# Patient Record
Sex: Female | Born: 1970 | Race: White | Hispanic: Yes | Marital: Single | Smoking: Current every day smoker
Health system: Southern US, Community
[De-identification: ages and names within clinical notes are randomized; demographics above are authoritative.]

## PROBLEM LIST (undated history)

## (undated) DIAGNOSIS — K5792 Diverticulitis of intestine, part unspecified, without perforation or abscess without bleeding: Secondary | ICD-10-CM

## (undated) HISTORY — PX: ABDOMINAL SURGERY: SHX537

## (undated) HISTORY — PX: NO PAST SURGERIES: SHX2092

---

## 2012-08-26 ENCOUNTER — Inpatient Hospital Stay (HOSPITAL_COMMUNITY)
Admission: EM | Admit: 2012-08-26 | Discharge: 2012-09-06 | DRG: 392 | Disposition: A | Discharge status: Home / Self Care | Payer: Medicaid Other | Attending: Family Medicine | Admitting: Family Medicine

## 2012-08-26 ENCOUNTER — Emergency Department (HOSPITAL_COMMUNITY): Payer: Medicaid Other

## 2012-08-26 DIAGNOSIS — K631 Perforation of intestine (nontraumatic): Secondary | ICD-10-CM | POA: Diagnosis present

## 2012-08-26 DIAGNOSIS — K5792 Diverticulitis of intestine, part unspecified, without perforation or abscess without bleeding: Secondary | ICD-10-CM | POA: Diagnosis present

## 2012-08-26 DIAGNOSIS — A498 Other bacterial infections of unspecified site: Secondary | ICD-10-CM | POA: Diagnosis present

## 2012-08-26 DIAGNOSIS — R109 Unspecified abdominal pain: Secondary | ICD-10-CM | POA: Diagnosis present

## 2012-08-26 DIAGNOSIS — R7881 Bacteremia: Secondary | ICD-10-CM | POA: Diagnosis present

## 2012-08-26 DIAGNOSIS — K5720 Diverticulitis of large intestine with perforation and abscess without bleeding: Secondary | ICD-10-CM | POA: Diagnosis present

## 2012-08-26 DIAGNOSIS — K5732 Diverticulitis of large intestine without perforation or abscess without bleeding: Secondary | ICD-10-CM

## 2012-08-26 DIAGNOSIS — D509 Iron deficiency anemia, unspecified: Secondary | ICD-10-CM | POA: Diagnosis present

## 2012-08-26 DIAGNOSIS — K630 Abscess of intestine: Secondary | ICD-10-CM | POA: Diagnosis present

## 2012-08-26 DIAGNOSIS — D649 Anemia, unspecified: Secondary | ICD-10-CM | POA: Diagnosis present

## 2012-08-26 LAB — URINALYSIS, ROUTINE W REFLEX MICROSCOPIC
Bilirubin Urine: NEGATIVE
Ketones, ur: NEGATIVE mg/dL
Leukocytes, UA: NEGATIVE
Nitrite: NEGATIVE
Protein, ur: 30 mg/dL — AB
Urobilinogen, UA: 0.2 mg/dL (ref 0.0–1.0)
pH: 8.5 — ABNORMAL HIGH (ref 5.0–8.0)

## 2012-08-26 LAB — URINE MICROSCOPIC-ADD ON

## 2012-08-26 LAB — CBC WITH DIFFERENTIAL/PLATELET
Eosinophils Absolute: 0.1 10*3/uL (ref 0.0–0.7)
Hemoglobin: 11.8 g/dL — ABNORMAL LOW (ref 12.0–15.0)
Lymphocytes Relative: 15 % (ref 12–46)
Lymphs Abs: 1.6 10*3/uL (ref 0.7–4.0)
MCH: 29.4 pg (ref 26.0–34.0)
MCV: 86.8 fL (ref 78.0–100.0)
Monocytes Relative: 12 % (ref 3–12)
Neutrophils Relative %: 71 % (ref 43–77)
Platelets: 398 10*3/uL (ref 150–400)
RBC: 4.02 MIL/uL (ref 3.87–5.11)
WBC: 10.6 10*3/uL — ABNORMAL HIGH (ref 4.0–10.5)

## 2012-08-26 LAB — COMPREHENSIVE METABOLIC PANEL
ALT: 9 U/L (ref 0–35)
Alkaline Phosphatase: 68 U/L (ref 39–117)
BUN: 9 mg/dL (ref 6–23)
CO2: 26 mEq/L (ref 19–32)
GFR calc Af Amer: 88 mL/min — ABNORMAL LOW (ref >90)
GFR calc non Af Amer: 76 mL/min — ABNORMAL LOW (ref >90)
Glucose, Bld: 118 mg/dL — ABNORMAL HIGH (ref 70–99)
Potassium: 4 mEq/L (ref 3.5–5.1)
Sodium: 140 mEq/L (ref 135–145)
Total Bilirubin: 0.4 mg/dL (ref 0.3–1.2)

## 2012-08-26 NOTE — ED Notes (Signed)
Pt tearful in waiting room, crying out in pain. Pt ambulatory. Will continue to monitor.

## 2012-08-26 NOTE — ED Notes (Signed)
Pt returned from xray, no distress noted 

## 2012-08-26 NOTE — ED Notes (Signed)
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  She states this is in her 16th day w/o etoh.  She states on Thursday she had onset of abd pain.  She states has not had a bm since Tuesday.  She states she has tried otc tx w/o relief.  She passed only watery stools.  Patient denies n/v.  Patient last po intake was today,  She is drinking only water.  She has noted fever in triage.  Patient states she has diffuse abd pain

## 2012-08-27 ENCOUNTER — Encounter (HOSPITAL_COMMUNITY): Payer: Self-pay | Admitting: Emergency Medicine

## 2012-08-27 ENCOUNTER — Emergency Department (HOSPITAL_COMMUNITY): Payer: Medicaid Other

## 2012-08-27 ENCOUNTER — Inpatient Hospital Stay (HOSPITAL_COMMUNITY): Payer: Medicaid Other

## 2012-08-27 DIAGNOSIS — R109 Unspecified abdominal pain: Secondary | ICD-10-CM | POA: Diagnosis present

## 2012-08-27 DIAGNOSIS — K5732 Diverticulitis of large intestine without perforation or abscess without bleeding: Principal | ICD-10-CM

## 2012-08-27 DIAGNOSIS — K631 Perforation of intestine (nontraumatic): Secondary | ICD-10-CM

## 2012-08-27 DIAGNOSIS — K5720 Diverticulitis of large intestine with perforation and abscess without bleeding: Secondary | ICD-10-CM | POA: Diagnosis present

## 2012-08-27 DIAGNOSIS — K630 Abscess of intestine: Secondary | ICD-10-CM

## 2012-08-27 DIAGNOSIS — K5792 Diverticulitis of intestine, part unspecified, without perforation or abscess without bleeding: Secondary | ICD-10-CM | POA: Diagnosis present

## 2012-08-27 LAB — CBC WITH DIFFERENTIAL/PLATELET
Lymphocytes Relative: 13 % (ref 12–46)
Lymphs Abs: 1.5 10*3/uL (ref 0.7–4.0)
MCH: 29.6 pg (ref 26.0–34.0)
MCV: 86.5 fL (ref 78.0–100.0)
Monocytes Absolute: 1.4 10*3/uL — ABNORMAL HIGH (ref 0.1–1.0)
Monocytes Relative: 12 % (ref 3–12)
Neutro Abs: 8.5 10*3/uL — ABNORMAL HIGH (ref 1.7–7.7)
Platelets: 361 10*3/uL (ref 150–400)
RBC: 3.71 MIL/uL — ABNORMAL LOW (ref 3.87–5.11)
RDW: 13.5 % (ref 11.5–15.5)
WBC: 11.5 10*3/uL — ABNORMAL HIGH (ref 4.0–10.5)

## 2012-08-27 LAB — URINALYSIS, ROUTINE W REFLEX MICROSCOPIC
Bilirubin Urine: NEGATIVE
Glucose, UA: NEGATIVE mg/dL
Hgb urine dipstick: NEGATIVE
Specific Gravity, Urine: 1.012 (ref 1.005–1.030)
pH: 6 (ref 5.0–8.0)

## 2012-08-27 LAB — LIPASE, BLOOD: Lipase: 12 U/L (ref 11–59)

## 2012-08-27 MED ORDER — ONDANSETRON HCL 4 MG PO TABS: 4.0000 mg | ORAL_TABLET | Freq: Four times a day (QID) | ORAL | Status: DC | PRN

## 2012-08-27 MED ORDER — HYDROMORPHONE HCL PF 1 MG/ML IJ SOLN
1.0000 mg | INTRAMUSCULAR | Status: DC | PRN
  Administered 2012-08-28 – 2012-09-06 (×32): 1 mg via INTRAVENOUS
  Filled 2012-08-27 (×38): qty 1

## 2012-08-27 MED ORDER — METRONIDAZOLE IN NACL 5-0.79 MG/ML-% IV SOLN
500.0000 mg | Freq: Once | INTRAVENOUS | Status: AC
  Administered 2012-08-27: 500 mg via INTRAVENOUS
  Filled 2012-08-27: qty 100

## 2012-08-27 MED ORDER — MORPHINE SULFATE 4 MG/ML IJ SOLN
4.0000 mg | Freq: Once | INTRAMUSCULAR | Status: AC
  Administered 2012-08-27: 4 mg via INTRAVENOUS
  Filled 2012-08-27: qty 1

## 2012-08-27 MED ORDER — HYDROMORPHONE HCL PF 1 MG/ML IJ SOLN
1.0000 mg | INTRAMUSCULAR | Status: DC | PRN
  Administered 2012-08-27 (×2): 1 mg via INTRAVENOUS
  Filled 2012-08-27 (×2): qty 1

## 2012-08-27 MED ORDER — SODIUM CHLORIDE 0.9 % IV SOLN
INTRAVENOUS | Status: DC
  Administered 2012-08-27: 07:00:00 via INTRAVENOUS

## 2012-08-27 MED ORDER — OXYCODONE HCL 5 MG PO TABS
5.0000 mg | ORAL_TABLET | ORAL | Status: DC | PRN
  Administered 2012-08-27 – 2012-09-06 (×20): 5 mg via ORAL
  Filled 2012-08-27 (×22): qty 1

## 2012-08-27 MED ORDER — ONDANSETRON HCL 4 MG/2ML IJ SOLN
4.0000 mg | Freq: Once | INTRAMUSCULAR | Status: AC
  Administered 2012-08-27: 4 mg via INTRAVENOUS
  Filled 2012-08-27: qty 2

## 2012-08-27 MED ORDER — ONDANSETRON HCL 4 MG/2ML IJ SOLN
4.0000 mg | Freq: Four times a day (QID) | INTRAMUSCULAR | Status: DC | PRN
  Administered 2012-08-27 – 2012-09-05 (×7): 4 mg via INTRAVENOUS
  Filled 2012-08-27 (×10): qty 2

## 2012-08-27 MED ORDER — ONDANSETRON HCL 4 MG/2ML IJ SOLN
INTRAMUSCULAR | Status: AC
  Filled 2012-08-27: qty 2

## 2012-08-27 MED ORDER — SODIUM CHLORIDE 0.9 % IV SOLN
INTRAVENOUS | Status: DC
  Administered 2012-08-27 – 2012-08-28 (×2): via INTRAVENOUS
  Administered 2012-08-28: 125 mL/h via INTRAVENOUS
  Administered 2012-08-29 – 2012-08-30 (×2): via INTRAVENOUS
  Administered 2012-08-30: 125 mL/h via INTRAVENOUS
  Administered 2012-08-30: 02:00:00 via INTRAVENOUS
  Administered 2012-08-31: 125 mL/h via INTRAVENOUS
  Administered 2012-09-01: 10:00:00 via INTRAVENOUS

## 2012-08-27 MED ORDER — ACETAMINOPHEN 325 MG PO TABS: 650.0000 mg | ORAL_TABLET | Freq: Four times a day (QID) | ORAL | Status: DC | PRN

## 2012-08-27 MED ORDER — METRONIDAZOLE IN NACL 5-0.79 MG/ML-% IV SOLN
500.0000 mg | Freq: Three times a day (TID) | INTRAVENOUS | Status: DC
  Administered 2012-08-27 – 2012-09-02 (×19): 500 mg via INTRAVENOUS
  Filled 2012-08-27 (×21): qty 100

## 2012-08-27 MED ORDER — IOHEXOL 300 MG/ML  SOLN
80.0000 mL | Freq: Once | INTRAMUSCULAR | Status: AC | PRN
  Administered 2012-08-27: 80 mL via INTRAVENOUS

## 2012-08-27 MED ORDER — ACETAMINOPHEN 325 MG PO TABS
650.0000 mg | ORAL_TABLET | Freq: Four times a day (QID) | ORAL | Status: DC | PRN
  Administered 2012-08-28 – 2012-08-30 (×5): 650 mg via ORAL
  Filled 2012-08-27 (×7): qty 2

## 2012-08-27 MED ORDER — CIPROFLOXACIN IN D5W 400 MG/200ML IV SOLN
400.0000 mg | Freq: Two times a day (BID) | INTRAVENOUS | Status: DC
  Administered 2012-08-27 – 2012-09-02 (×13): 400 mg via INTRAVENOUS
  Filled 2012-08-27 (×14): qty 200

## 2012-08-27 MED ORDER — IOHEXOL 300 MG/ML  SOLN
20.0000 mL | INTRAMUSCULAR | Status: DC
Start: 2012-08-27 — End: 2012-08-27
  Administered 2012-08-27: 20 mL via ORAL

## 2012-08-27 MED ORDER — SODIUM CHLORIDE 0.9 % IV BOLUS (SEPSIS)
1000.0000 mL | Freq: Once | INTRAVENOUS | Status: AC
  Administered 2012-08-27: 1000 mL via INTRAVENOUS

## 2012-08-27 MED ORDER — SODIUM CHLORIDE 0.9 % IJ SOLN
3.0000 mL | Freq: Two times a day (BID) | INTRAMUSCULAR | Status: DC
  Administered 2012-08-27 – 2012-09-06 (×7): 3 mL via INTRAVENOUS

## 2012-08-27 MED ORDER — LORAZEPAM 2 MG/ML IJ SOLN
1.0000 mg | Freq: Once | INTRAMUSCULAR | Status: AC
  Administered 2012-08-27: 1 mg via INTRAVENOUS
  Filled 2012-08-27: qty 1

## 2012-08-27 MED ORDER — CIPROFLOXACIN IN D5W 400 MG/200ML IV SOLN
400.0000 mg | Freq: Once | INTRAVENOUS | Status: AC
  Administered 2012-08-27: 400 mg via INTRAVENOUS
  Filled 2012-08-27: qty 200

## 2012-08-27 NOTE — Progress Notes (Signed)
Admiited patient from ED, came in per wheelchair assisted by the NT from ED. Patient AOx4, not in respiratory distress, complaining of left lower quadrant abdominal burning pain with pain scale 9/10. Put on tele. BP 106/68 HR 73 RR 18 and O2sat 98% on room air.

## 2012-08-27 NOTE — H&P (Signed)
Chief Complaint:  abd pain  HPI: 41 yo healthy female comes in with several days of generalized abd pain mainly in lower quadrants with loose nonbloody stools.  No n/v.  No fevers.  Overall healthy.  Ct shows diverticulitis with absess/small perf.  No recent abx.  Review of Systems:  O/w neg  Past Medical History: History reviewed. No pertinent past medical history. Past Surgical History  Procedure Date  . Cesarean section     Medications: Prior to Admission medications   Medication Sig Start Date End Date Taking? Authorizing Provider  Bisacodyl (CORRECTOL PO) Take 1-2 tablets by mouth daily as needed. constipation   Yes Historical Provider, MD    Allergies:  No Known Allergies  Social History:  does not have a smoking history on file. She does not have any smokeless tobacco history on file. Her alcohol and drug histories not on file.  Family History: neg  Physical Exam: Filed Vitals:   08/26/12 1909 08/26/12 1910 08/27/12 0049  BP:  128/114 106/68  Pulse:  100 76  Temp:  101.5 F (38.6 C) 99.1 F (37.3 C)  TempSrc:  Oral Oral  Resp:  20 20  Height: 5\' 2"  (1.575 m)    Weight: 84.369 kg (186 lb)    SpO2:  97% 97%   General appearance: alert, cooperative and no distress Neck: no JVD and supple, symmetrical, trachea midline Lungs: clear to auscultation bilaterally Heart: regular rate and rhythm, S1, S2 normal, no murmur, click, rub or gallop Abdomen: soft nd ttp diffuse no r/g nonacute abd pos bs Extremities: extremities normal, atraumatic, no cyanosis or edema Pulses: 2+ and symmetric Skin: Skin color, texture, turgor normal. No rashes or lesions Neurologic: Grossly normal    Labs on Admission:   [facility] 08/26/12 1923  NA 140  K 4.0  CL 101  CO2 26  GLUCOSE 118*  BUN 9  CREATININE 0.92  CALCIUM 9.9  MG --  PHOS --    Basename 08/26/12 1923  AST 18  ALT 9  ALKPHOS 68  BILITOT 0.4  PROT 7.8  ALBUMIN 3.7    Basename 08/27/12 0004  LIPASE  12  AMYLASE --    Basename 08/26/12 1923  WBC 10.6*  NEUTROABS 7.6  HGB 11.8*  HCT 34.9*  MCV 86.8  PLT 398    Radiological Exams on Admission: Dg Abd 1 View  08/26/2012  *RADIOLOGY REPORT*  Clinical Data: Lower abdominal pain.  ABDOMEN - 1 VIEW  Comparison: None.  Findings: The bowel gas pattern is normal.  No radiopaque calculi identified.  Right pelvic phlebolith noted.  No evidence of abnormal mass effect.  IMPRESSION: Unremarkable bowel gas pattern.  No acute findings.   Original Report Authenticated By: Myles Rosenthal, M.D.    Ct Abdomen Pelvis W Contrast  08/27/2012  *RADIOLOGY REPORT*  Clinical Data: Lower abdominal pain.  CT ABDOMEN AND PELVIS WITH CONTRAST  Technique:  Multidetector CT imaging of the abdomen and pelvis was performed following the standard protocol during bolus administration of intravenous contrast.  Contrast: 80mL OMNIPAQUE IOHEXOL 300 MG/ML  SOLN  Comparison: None.  Findings: Limited images through the lung bases demonstrate no significant appreciable abnormality. The heart size is within normal limits. No pleural or pericardial effusion.  Unremarkable liver, biliary system, spleen, pancreas, adrenal glands, kidneys.  No hydronephrosis or hydroureter.  Normal appendix.  Sigmoid colon diverticulosis with evidence of diverticulitis.  Contained perforation is suggested with a 3.5 x 3.5 cm air debris collection along the medial margin  of the sigmoid colon on image 67.  No free intraperitoneal air elsewhere.  No lymphadenopathy.  Small amount of free fluid dependently within the pelvis.  Thin-walled bladder.  Unremarkable CT appearance to the uterus and adnexa.  And mild degenerative changes of the left hip and lower lumbar spine.  No acute osseous finding.  IMPRESSION: Sigmoid colon diverticulitis with complicating features most in keeping with a contained perforation measuring 3.5 cm.  Discussed via telephone with Dr. Dierdre Highman at 02:30 a.m. on 08/27/2012.   Original Report  Authenticated By: Jearld Lesch, M.D.     Assessment/Plan  41 yo female with acute diverticulitis complicated with absess/perforation Principal Problem:  *Acute diverticulitis Active Problems:  Abdominal pain  Perforated bowel  Colonic diverticular abscess  Iv flagyl/cipro.  Surgery consult.  No other active medical issues.  Full code.  Yacoub Diltz A 08/27/2012, 5:24 AM

## 2012-08-27 NOTE — Significant Event (Signed)
[facility] [place] [facility] call Dr Lindie Spruce  informed of pt status change. Pt c/o increased abd, pain, temp up from 98.8 at 1430pm to 101.2 at 1920pm and increased swelling noted to abd . Orders received will continue to monitor patient.

## 2012-08-27 NOTE — ED Provider Notes (Signed)
History     CSN: [id number]  Arrival date & time 08/26/12  5284   First MD Initiated Contact with Patient 08/26/12 2348      Chief Complaint  Patient presents with  . Abdominal Pain   HPI  History provided by the patient. Patient is a 41 year old female with past history of cesarean section and alcoholism who presents with complaints of continued constipation and lower abdominal pain. Patient reports not having a significant bowel movement for the past one week. She has tried over-the-counter stool softeners and laxatives but reports no significant success in having a bowel movement. She reports some watery discharge from rectum but denies any blood or mucus. Since that time patient reports having significant increasing sharp lower abdominal pains that wax and wane. As worse pain is a 10 out of 10 in generally maintains a level of 7 or 8. Patient has not used any other medications to help with pain symptoms. She denies any fever, chills or sweats. Denies any nausea vomiting symptoms. She denies any urinary complaints. Denies any vaginal bleeding or vaginal discharge. Patient also mentions that she has only been 16 days sober. She was a daily heavy alcohol user. She denies having any shakes or tremors. Denies any sweats.     No past medical history on file.  Past Surgical History  Procedure Date  . Cesarean section     No family history on file.  History  Substance Use Topics  . Smoking status: Not on file  . Smokeless tobacco: Not on file  . Alcohol Use: Not on file    OB History    No data available      Review of Systems  Constitutional: Negative for fever, chills and diaphoresis.  Respiratory: Negative for shortness of breath.   Cardiovascular: Negative for chest pain.  Gastrointestinal: Positive for abdominal pain and constipation. Negative for nausea, vomiting and blood in stool.  Genitourinary: Negative for dysuria, frequency, hematuria, flank pain, vaginal  bleeding and vaginal discharge.  All other systems reviewed and are negative.    Allergies  Review of patient's allergies indicates no known allergies.  Home Medications   Current Outpatient Rx  Name  Route  Sig  Dispense  Refill  . CORRECTOL PO   Oral   Take 1-2 tablets by mouth daily as needed. constipation           BP 128/114  Pulse 100  Temp 101.5 F (38.6 C) (Oral)  Resp 20  Ht 5\' 2"  (1.575 m)  Wt 186 lb (84.369 kg)  BMI 34.02 kg/m2  SpO2 97%  LMP 08/19/2012  Physical Exam  Nursing note and vitals reviewed. Constitutional: She is oriented to person, place, and time. She appears well-developed and well-nourished. No distress.  HENT:  Head: Normocephalic.  Cardiovascular: Normal rate and regular rhythm.   No murmur heard. Pulmonary/Chest: Effort normal and breath sounds normal. No respiratory distress. She has no wheezes. She has no rales.  Abdominal: Soft. There is tenderness in the right lower quadrant, suprapubic area and left lower quadrant. There is no rigidity, no rebound, no guarding, no CVA tenderness, no tenderness at McBurney's point and negative Murphy's sign.       Patient obese  Neurological: She is alert and oriented to person, place, and time.  Skin: Skin is warm and dry.  Psychiatric: She has a normal mood and affect. Her behavior is normal.    ED Course  Procedures   Results for orders placed during the  hospital encounter of 08/26/12  CBC WITH DIFFERENTIAL      Component Value Range   WBC 10.6 (*) 4.0 - 10.5 K/uL   RBC 4.02  3.87 - 5.11 MIL/uL   Hemoglobin 11.8 (*) 12.0 - 15.0 g/dL   HCT 16.1 (*) 09.6 - 04.5 %   MCV 86.8  78.0 - 100.0 fL   MCH 29.4  26.0 - 34.0 pg   MCHC 33.8  30.0 - 36.0 g/dL   RDW 40.9  81.1 - 91.4 %   Platelets 398  150 - 400 K/uL   Neutrophils Relative 71  43 - 77 %   Neutro Abs 7.6  1.7 - 7.7 K/uL   Lymphocytes Relative 15  12 - 46 %   Lymphs Abs 1.6  0.7 - 4.0 K/uL   Monocytes Relative 12  3 - 12 %    Monocytes Absolute 1.3 (*) 0.1 - 1.0 K/uL   Eosinophils Relative 1  0 - 5 %   Eosinophils Absolute 0.1  0.0 - 0.7 K/uL   Basophils Relative 0  0 - 1 %   Basophils Absolute 0.0  0.0 - 0.1 K/uL  COMPREHENSIVE METABOLIC PANEL      Component Value Range   Sodium 140  135 - 145 mEq/L   Potassium 4.0  3.5 - 5.1 mEq/L   Chloride 101  96 - 112 mEq/L   CO2 26  19 - 32 mEq/L   Glucose, Bld 118 (*) 70 - 99 mg/dL   BUN 9  6 - 23 mg/dL   Creatinine, Ser 7.82  0.50 - 1.10 mg/dL   Calcium 9.9  8.4 - 95.6 mg/dL   Total Protein 7.8  6.0 - 8.3 g/dL   Albumin 3.7  3.5 - 5.2 g/dL   AST 18  0 - 37 U/L   ALT 9  0 - 35 U/L   Alkaline Phosphatase 68  39 - 117 U/L   Total Bilirubin 0.4  0.3 - 1.2 mg/dL   GFR calc non Af Amer 76 (*) >90 mL/min   GFR calc Af Amer 88 (*) >90 mL/min  URINALYSIS, ROUTINE W REFLEX MICROSCOPIC      Component Value Range   Color, Urine YELLOW  YELLOW   APPearance CLOUDY (*) CLEAR   Specific Gravity, Urine 1.015  1.005 - 1.030   pH 8.5 (*) 5.0 - 8.0   Glucose, UA NEGATIVE  NEGATIVE mg/dL   Hgb urine dipstick NEGATIVE  NEGATIVE   Bilirubin Urine NEGATIVE  NEGATIVE   Ketones, ur NEGATIVE  NEGATIVE mg/dL   Protein, ur 30 (*) NEGATIVE mg/dL   Urobilinogen, UA 0.2  0.0 - 1.0 mg/dL   Nitrite NEGATIVE  NEGATIVE   Leukocytes, UA NEGATIVE  NEGATIVE  POCT PREGNANCY, URINE      Component Value Range   Preg Test, Ur NEGATIVE  NEGATIVE  URINE MICROSCOPIC-ADD ON      Component Value Range   Squamous Epithelial / LPF FEW (*) RARE   Bacteria, UA FEW (*) RARE   Casts HYALINE CASTS (*) NEGATIVE   Urine-Other AMORPHOUS URATES/PHOSPHATES    LIPASE, BLOOD      Component Value Range   Lipase 12  11 - 59 U/L       Dg Abd 1 View  08/26/2012  *RADIOLOGY REPORT*  Clinical Data: Lower abdominal pain.  ABDOMEN - 1 VIEW  Comparison: None.  Findings: The bowel gas pattern is normal.  No radiopaque calculi identified.  Right pelvic phlebolith noted.  No  evidence of abnormal mass effect.   IMPRESSION: Unremarkable bowel gas pattern.  No acute findings.   Original Report Authenticated By: Myles Rosenthal, M.D.    Ct Abdomen Pelvis W Contrast  08/27/2012  *RADIOLOGY REPORT*  Clinical Data: Lower abdominal pain.  CT ABDOMEN AND PELVIS WITH CONTRAST  Technique:  Multidetector CT imaging of the abdomen and pelvis was performed following the standard protocol during bolus administration of intravenous contrast.  Contrast: 80mL OMNIPAQUE IOHEXOL 300 MG/ML  SOLN  Comparison: None.  Findings: Limited images through the lung bases demonstrate no significant appreciable abnormality. The heart size is within normal limits. No pleural or pericardial effusion.  Unremarkable liver, biliary system, spleen, pancreas, adrenal glands, kidneys.  No hydronephrosis or hydroureter.  Normal appendix.  Sigmoid colon diverticulosis with evidence of diverticulitis.  Contained perforation is suggested with a 3.5 x 3.5 cm air debris collection along the medial margin of the sigmoid colon on image 67.  No free intraperitoneal air elsewhere.  No lymphadenopathy.  Small amount of free fluid dependently within the pelvis.  Thin-walled bladder.  Unremarkable CT appearance to the uterus and adnexa.  And mild degenerative changes of the left hip and lower lumbar spine.  No acute osseous finding.  IMPRESSION: Sigmoid colon diverticulitis with complicating features most in keeping with a contained perforation measuring 3.5 cm.  Discussed via telephone with Dr. Dierdre Highman at 02:30 a.m. on 08/27/2012.   Original Report Authenticated By: Jearld Lesch, M.D.      1. Diverticulitis of sigmoid colon       MDM  12:20AM patient seen and evaluated. Patient in no acute distress but appears uncomfortable. Triage vital signs showed patient with fever.   Patient continues to have some significant lower abdominal pains. Will obtain CT scan for further evaluation.   Patient was discussed with attending physician. CT scan show signs  concerning for small diverticular abscess. Will begin Cipro Flagyl.  Spoke with [facility] for unassigned admission. They requested surgery be called for initial evaluation and decision on admission.  Spoke with general surgery. They will come see patient evaluate.  Surgery has seen pt.  They may consider drain placement.  [facility] hospital[facility] has seen pt per nurse and will admit.   Angus Seller, [place] 08/27/12 [phone]

## 2012-08-27 NOTE — Progress Notes (Signed)
The patient's 3-way abdominal X-rays do not  Show free air.  Increased abdominal distension hard to assess since I have not seen her before.  I will come in to evaluate.    Marta Lamas. Gae Bon, MD, FACS [phone] [facility]

## 2012-08-27 NOTE — Progress Notes (Signed)
Examined the patient.  She rates her pain a 7/10, but better than yesterday  When it was a 10/10.  On examination she definitely does not have peritonitis.  She has normoactive bowel sound.  Her temperature has dropped from 101.2 to 99.  Her abdominal X-rays do not show free air.  We can continue present course with IV antibiotics and pain control.  Marta Lamas. Gae Bon, MD, FACS [phone] [facility]

## 2012-08-27 NOTE — Progress Notes (Addendum)
Pt pain now at a 10. Scheduled IV Cipro started.  Craige Cotta saw pt and wrote new orders for blood cultures and pain medicine. Will continue to monitor pt. Baron Hamper RN 08/27/2012 7:52 PM

## 2012-08-27 NOTE — Progress Notes (Signed)
I have seen and assessed patient and agree with Dr. Onalee Hua assessment and plan. Continue current empiric IV antibiotics. General surgery is following.

## 2012-08-27 NOTE — Progress Notes (Signed)
Patient interviewed and examined, agree with NP note above.  Mariella Saa MD, FACS  08/27/2012 11:58 AM

## 2012-08-27 NOTE — Progress Notes (Addendum)
Shift event: RN paged NP 2/2 to increased abdominal swelling, fever, and pain. NP to bedside. S: Pt says her pain has increased but only when she sits up or urinates. The pain that caused her to come to the ED is actually better, stating it was a "13/10" on admission and now is a "10/10". She denies any burning with urination, n/v/d. Tolerating ice chips and sips fine. She has started feeling hot and only has chills when she urinates. She denies any resp sx including congestion or cough. O: Chart/notes and labs reviewed. Surgery note reviewed. 41 yo AAF sitting in bed, appears well and she exhibits no distress. She holds her belly and groans only when she moves around. Card: S1S2 without MRG. Resp: normal effort. Abd: Slightly distended without warmth. BS are + x 4 but sluggish. Tender with palpation in the suprapubic and epigastric region. No guarding or rebound.  A/P: 1. Small bowel perf-I don't think her sx worsening are related to this perforation, but rather her increasing her abd pressure by moving around and/or urinating. Her presenting pain is actually better. She has no n/v/d. For the fever, will do sepsis workup with UA, blood cxs, and a CBC with diff. Will check an abd 1V. She has no resp sx so will forego CXR at this time. Will increase frequency of Dilaudid for tonight and add Oxy IR prn. Tylenol after cultures drawn.  I think for now, it is reasonable to watch her. Her BP and HR are stable, so no outright signs of sepsis at this time. Should she worse, will call surgery. Cont IV abx, NPO except sips/chips. RN instructed to call for increased fever, HR or pain and decrease in BP trend. Will cont to follow.  Addendum: CBC with incr WBCC. Spoke with Dr. Lindie Spruce, gen surg, who will come and see pt tonight. Abd xray without free air.  Maren Reamer, NP [facility]

## 2012-08-27 NOTE — Progress Notes (Signed)
Subjective: Continues with diffuse abdominal pain mostly in the midabdomen suprapubic region. No N/V/D. Remains NPO.  Objective: Vital signs in last 24 hours: Temp:  [98.4 F (36.9 C)-101.5 F (38.6 C)] 98.4 F (36.9 C) (12/18 0615) Pulse Rate:  [72-100] 73  (12/18 0701) Resp:  [18-20] 18  (12/18 0701) BP: (106-128)/(62-114) 106/68 mmHg (12/18 0701) SpO2:  [97 %-98 %] 98 % (12/18 0701) Weight:  [186 lb (84.369 kg)-397 lb 7.8 oz (180.3 kg)] 397 lb 7.8 oz (180.3 kg) (12/18 0701)    Intake/Output from previous day:   Intake/Output this shift:    General appearance: alert, cooperative, appears stated age and mild distress Chest: CTA Cardiac: RRR Abdomen: soft, diffusely tender in mid suprapubic region, + BS, No N/V/D. Extremites: warm to touch, no edema or tenderness, + pulses   Lab Results:   [facility] 08/26/12 1923  WBC 10.6*  HGB 11.8*  HCT 34.9*  PLT 398   BMET  Basename 08/26/12 1923  NA 140  K 4.0  CL 101  CO2 26  GLUCOSE 118*  BUN 9  CREATININE 0.92  CALCIUM 9.9   PT/INR No results found for this basename: LABPROT:2,INR:2 in the last 72 hours ABG No results found for this basename: PHART:2,PCO2:2,PO2:2,HCO3:2 in the last 72 hours  Studies/Results: Dg Abd 1 View  08/26/2012  *RADIOLOGY REPORT*  Clinical Data: Lower abdominal pain.  ABDOMEN - 1 VIEW  Comparison: None.  Findings: The bowel gas pattern is normal.  No radiopaque calculi identified.  Right pelvic phlebolith noted.  No evidence of abnormal mass effect.  IMPRESSION: Unremarkable bowel gas pattern.  No acute findings.   Original Report Authenticated By: Myles Rosenthal, M.D.    Ct Abdomen Pelvis W Contrast  08/27/2012  *RADIOLOGY REPORT*  Clinical Data: Lower abdominal pain.  CT ABDOMEN AND PELVIS WITH CONTRAST  Technique:  Multidetector CT imaging of the abdomen and pelvis was performed following the standard protocol during bolus administration of intravenous contrast.  Contrast: 80mL OMNIPAQUE  IOHEXOL 300 MG/ML  SOLN  Comparison: None.  Findings: Limited images through the lung bases demonstrate no significant appreciable abnormality. The heart size is within normal limits. No pleural or pericardial effusion.  Unremarkable liver, biliary system, spleen, pancreas, adrenal glands, kidneys.  No hydronephrosis or hydroureter.  Normal appendix.  Sigmoid colon diverticulosis with evidence of diverticulitis.  Contained perforation is suggested with a 3.5 x 3.5 cm air debris collection along the medial margin of the sigmoid colon on image 67.  No free intraperitoneal air elsewhere.  No lymphadenopathy.  Small amount of free fluid dependently within the pelvis.  Thin-walled bladder.  Unremarkable CT appearance to the uterus and adnexa.  And mild degenerative changes of the left hip and lower lumbar spine.  No acute osseous finding.  IMPRESSION: Sigmoid colon diverticulitis with complicating features most in keeping with a contained perforation measuring 3.5 cm.  Discussed via telephone with Dr. Dierdre Highman at 02:30 a.m. on 08/27/2012.   Original Report Authenticated By: Jearld Lesch, M.D.     Anti-infectives: Anti-infectives     Start     Dose/Rate Route Frequency Ordered Stop   08/27/12 0700   ciprofloxacin (CIPRO) IVPB 400 mg        400 mg 200 mL/hr over 60 Minutes Intravenous Every 12 hours 08/27/12 0651     08/27/12 0700   metroNIDAZOLE (FLAGYL) IVPB 500 mg        500 mg 100 mL/hr over 60 Minutes Intravenous Every 8 hours 08/27/12 0651  08/27/12 0300   ciprofloxacin (CIPRO) IVPB 400 mg        400 mg 200 mL/hr over 60 Minutes Intravenous  Once 08/27/12 0256 08/27/12 0423   08/27/12 0300   metroNIDAZOLE (FLAGYL) IVPB 500 mg        500 mg 100 mL/hr over 60 Minutes Intravenous  Once 08/27/12 0256 08/27/12 0512          Assessment/Plan: s/p * No surgery found *  Patient Active Problem List  Diagnosis  . Abdominal pain  . Perforated bowel  . Acute diverticulitis  . Colonic  diverticular abscess  Management per medicine team Continue NPO, IVF abx Consult with interventional radiology regarding possible percutaneous drainage, but there is no surgical intervention planned at this time.  Discussed with patient possibility of surgical intervention if abdominal pain worsens. Will continue to follow at this time.   LOS: 1 day    Blenda [facility]  Pager # [phone] 08/27/2012

## 2012-08-27 NOTE — ED Notes (Signed)
General Surgeon at bedside.

## 2012-08-27 NOTE — Progress Notes (Signed)
General information Packet  Medicaid accepting [place] Providers  [facility] [address]  [phone] 9am-7pm Mon-Fri 9 am -1pm Sat Speak with Pam H if you are self pay (no insurance coverage)   [facility] [phone]  Dr Edwin Avbeure at [facility] [address]  [place], [place], [address] [phone] [address]  [place] [phone] Dr Mohammed Garba at [facility]   [address] [place], [place] [address] [phone]   Dr.  Elizabeth [facility] [address] [phone]  [facility]  [address] [place] [address] [phone] Mon-Fri 8-5 closed 12-1 for lunch   Veita Bland Only accepts [place] Access Medicaid patients after they have Dr. Bland's name applied to the medicaid card [address] [place], [place] [address] [phone]  Self-pay/Un-insured (patient that does not have insurance coverage) [place] providers  [facility] [place] [address] [place] [address] Ph [phone] Hours Mon-Wed 8:30am-5pm & Thurs 8:30am-8pm $5 per visit/Call for an eligibility appointment  [facility] [address]  [phone] 9am-7pm Mon-Fri --9 am -1pm Sat Speak with Pam H if you are self pay (no insurance coverage)   [facility]  [address] [place] [address] [place], [place] [address] [phone] [place] [phone] take most major insurance cards, Medicare and Medicaid. [email or web address] $45 per visit/Walk-in only Offer a payment plan for Uninsured also Staff also speak Spanish   [facility] [address] [place] [address] Ph [phone] Every 2nd Saturday 9am-12pm [email or web address] FREE Services [facility] [address] [place] [address] [place] [address]  [phone], [place]  [phone] Dr George Osei-Bonsu  [facility]: [address] [place] [address] [place], [place] [address] Phone: [phone]  [facility]  [address] [place] [address], [place]  [phone]    [address], [place]  [phone]  [facility] [address] [place] [address] Clinic hours 1st & 3rd Saturdays of every month 10-1 pm [phone] fax [phone] - No Case manager, Limited services [email or web address] Sliding fee scale/Call to make an appointment [facility] [phone]   Urgent care-- [address], [place] [phone] or [address], [place], [place], [address]   Sickle Cell patient can go to [facility] Dr Eric Dean MCR MCD [place] [facility] [address] [place] [phone] Tywan Lindsey, LCSW [phone] Jackie NP  [facility] [phone] or Physician Referral service [phone] choose option #2 or 1 [phone] or [facility] urgent [facility] center [phone]  Inclusive Health Insurance [phone] Monday-Friday 8AM-5Pm EST or email [email or web address] created for [place] with pre-existing conditions looking for more affordable health coverage. It includes two options, Inclusive Health - Federal Option is for people who have been without coverage for 6 months or more. Inclusive Health - State Option is for anyone else. Each of these options has its own eligibility criteria  [place] [facility] [address], [address]  [place],  [address] [phone]  DEPARTMENTS OF SOCIAL SERVICES/HEALTH DEPARTMENTS BY COUNTY [place]:  :   [phone] (main) [address] [place], [place] [address]    **Medicaid Transportation: 641-4848 Child Protective Services: 641-3795 Adult Protective Services: 641-3137 APS and CPS  after-hours: [phone] Domestic Violence Crisis Line: [phone] Crisis Intervention: 641-2517 or 641-2518 Food Stamps: 641-6996 or 641-3869 Services for the Blind: 641-4692 [place]: [phone] (main) [address] [place] [address] **Medicaid Transportation: 845-4848 Domestic Violence Crisis Line: 889-7273  EXTRA INFORMATION [place] [facility] Department (main): [phone] Maternity Care Coordination (Baby Love): 641-3085 Child Service Coordination/Early Intervention Program/Heather Carter: 641-3181 Maternity Program - Women's Health: [phone] WIC: 641-3214 (Ann Smith is very helpful.  I always ask for her)  **Medication assistance program contact number at health dept [phone]   **COST EFFICIENT PLACES TO GET MEDICATIONS--[facility], [facility] &[facility] have $4 generic medication programs- **[employer] offers Free 30 day generic antibiotics and diabetic medications --[facility] -Has Self pay applications also   **www.needymeds.org for drug patient assistance programs--Helpline calls dial [phone].  [facility] has Wal-mart brand Insulin without a RX for $24.98 -- 70/30,  Relion Humulin R,  [place] N Novolin/ReliOn Human Insulin Novolin/ReliOn is human insulin that works to lower your blood sugar (glucose). Novolin/ReliOn is manufactured for [employer] by Novo Nordisk, the world's leader in insulin production. The Novolin/ReliOn line includes the following types of insulin: . Novolin N (NPH human insulin [rDNA origin] isophane suspension) . Novolin R U-100 (regular insulin human injection, USP [rDNA origin]) . Novolin 70/30 (70% human insulin isophane suspension, 30% human insulin injection [rDNA origin]) Novolin/ReliOn Human Insulin - $24.88 Any change in insulin should be made cautiously and only under the supervision of a doctor. Novolin is a registered trademark of Novo Nordisk. Insulin syringes $13.33 for box of 100 syringes  [facility] -Offers Discounts   Pharmacy that delivers depending on location [facility] [address] [place], [place] [address] Phone: [phone] Fax: [phone] [facility] [address] [place] [address], [place] [address] [phone] fax [phone] M-F 9a-7p Sat 10a-3 pm Sunday closed    HOUSING/SHELTERS  Housing Authorities: [place]: [phone]  FINANCIAL ASSISTANCE     Department of Social Services [phone]   [facility]: [phone], [address], [place], [place]    [facility] [place]: [phone], [address] [place]   [facility] [place]: [phone], [address], , [place]   [facility] [place]: [phone], [address], [place], [place], [place]   [facility] [place]: [phone], [address] [place], [place]   [facility]: [phone], [address], [place], [place]    [facility] [place]: [phone], [address], [place]   [facility] [place]: [phone], [address], [place]   [facility] [place]: [phone], [address], [place] Housing We don't want anyone to have to sleep on the streets.  The [facility]'s [facility] Housing program can help you with referrals, information and education about: *Rental housing  *Transitional housing  *Residential treatment    *Emergency overnight shelter  *Tenant and landlord rights  If you are in immediate need of a bed in an emergency shelter please come to the [facility] ([facility]- [address], [place], [place] [address] [phone]) by 2:00 pm Monday-Friday and sign in at the front desk.  Come if you need showers, laundry, computer access, job counseling, resume help, referrals for food/clothing, Mental Health assistance and housing counseling Please   note: the bed we secure for you may not be in [place]; we work with emergency shelters throughout the  region. To make an appointment to talk with a counselor about long-term housing solutions please call [phone] [facility] Organizing for justice, equality, dignity, worth and the enormous potential of all people.[facility] [address] [place] [address] from 5AM to 8#0 am Monday, Tuesday, Thursday & Friday [facility] [address], [place], [place] [address] Contact: [phone] [facility] Emergency Assistance Program (EAP) provides food and financial aid to people in need in [place]. No referrals are needed for food or financial assistance. .Financial interviews available: Mon- Fri 9:00 A.M. - 4:00 P.M. For More Information, contact Emergency Assistance: [phone] When [facility] provides financial assistance the money will be paid directly to the provider of the service or need. A [facility] Application and/or Agreement must  be completed prior to services provided.  All applications and agreements for services will be made at [facility] at [address], [facility] at [address], or [facility] at [address]. / or email    

## 2012-08-27 NOTE — Consult Note (Signed)
Reason for Consult:DIVERTICULITIS Referring Physician: Dr. Nile Riggs Heather [name] is an 41 y.o. female.  HPI: the patient is a 41 year old female with a 6 day history of abdominal pain. Patient initially thought she was constipated and took several laxatives to help a bowel movement. She states at this time she had watery diarrhea but with the sensation of no complete evacuation.  She states there was no blood within the bowel movements. She states she has not had pain like this before the past. sHe did not have any vomiting or diarrhea.  The patient does have a lengthy alcohol history is that she's not had any alcohol for approximately 18 days.  The patient has never had a colonoscopy.  History reviewed. No pertinent past medical history.  Past Surgical History  Procedure Date  . Cesarean section     No family history on file.  Social History:  does not have a smoking history on file. She does not have any smokeless tobacco history on file. Her alcohol and drug histories not on file.  Allergies: No Known Allergies  Medications: I have reviewed the patient's current medications.  Results for orders placed during the hospital encounter of 08/26/12 (from the past 48 hour(s))  URINALYSIS, ROUTINE W REFLEX MICROSCOPIC     Status: Abnormal   Collection Time   08/26/12  7:20 PM      Component Value Range Comment   Color, Urine YELLOW  YELLOW    APPearance CLOUDY (*) CLEAR    Specific Gravity, Urine 1.015  1.005 - 1.030    pH 8.5 (*) 5.0 - 8.0    Glucose, UA NEGATIVE  NEGATIVE mg/dL    Hgb urine dipstick NEGATIVE  NEGATIVE    Bilirubin Urine NEGATIVE  NEGATIVE    Ketones, ur NEGATIVE  NEGATIVE mg/dL    Protein, ur 30 (*) NEGATIVE mg/dL    Urobilinogen, UA 0.2  0.0 - 1.0 mg/dL    Nitrite NEGATIVE  NEGATIVE    Leukocytes, UA NEGATIVE  NEGATIVE   URINE MICROSCOPIC-ADD ON     Status: Abnormal   Collection Time   08/26/12  7:20 PM      Component Value Range Comment   Squamous Epithelial /  LPF FEW (*) RARE    Bacteria, UA FEW (*) RARE    Casts HYALINE CASTS (*) NEGATIVE    Urine-Other AMORPHOUS URATES/PHOSPHATES     CBC WITH DIFFERENTIAL     Status: Abnormal   Collection Time   08/26/12  7:23 PM      Component Value Range Comment   WBC 10.6 (*) 4.0 - 10.5 K/uL    RBC 4.02  3.87 - 5.11 MIL/uL    Hemoglobin 11.8 (*) 12.0 - 15.0 g/dL    HCT 91.4 (*) 78.2 - 46.0 %    MCV 86.8  78.0 - 100.0 fL    MCH 29.4  26.0 - 34.0 pg    MCHC 33.8  30.0 - 36.0 g/dL    RDW 95.6  21.3 - 08.6 %    Platelets 398  150 - 400 K/uL    Neutrophils Relative 71  43 - 77 %    Neutro Abs 7.6  1.7 - 7.7 K/uL    Lymphocytes Relative 15  12 - 46 %    Lymphs Abs 1.6  0.7 - 4.0 K/uL    Monocytes Relative 12  3 - 12 %    Monocytes Absolute 1.3 (*) 0.1 - 1.0 K/uL    Eosinophils Relative 1  0 - 5 %    Eosinophils Absolute 0.1  0.0 - 0.7 K/uL    Basophils Relative 0  0 - 1 %    Basophils Absolute 0.0  0.0 - 0.1 K/uL   COMPREHENSIVE METABOLIC PANEL     Status: Abnormal   Collection Time   08/26/12  7:23 PM      Component Value Range Comment   Sodium 140  135 - 145 mEq/L    Potassium 4.0  3.5 - 5.1 mEq/L    Chloride 101  96 - 112 mEq/L    CO2 26  19 - 32 mEq/L    Glucose, Bld 118 (*) 70 - 99 mg/dL    BUN 9  6 - 23 mg/dL    Creatinine, Ser 1.61  0.50 - 1.10 mg/dL    Calcium 9.9  8.4 - 09.6 mg/dL    Total Protein 7.8  6.0 - 8.3 g/dL    Albumin 3.7  3.5 - 5.2 g/dL    AST 18  0 - 37 U/L    ALT 9  0 - 35 U/L    Alkaline Phosphatase 68  39 - 117 U/L    Total Bilirubin 0.4  0.3 - 1.2 mg/dL    GFR calc non Af Amer 76 (*) >90 mL/min    GFR calc Af Amer 88 (*) >90 mL/min   POCT PREGNANCY, URINE     Status: Normal   Collection Time   08/26/12  7:30 PM      Component Value Range Comment   Preg Test, Ur NEGATIVE  NEGATIVE   LIPASE, BLOOD     Status: Normal   Collection Time   08/27/12 12:04 AM      Component Value Range Comment   Lipase 12  11 - 59 U/L     Dg Abd 1 View  08/26/2012  *RADIOLOGY  REPORT*  Clinical Data: Lower abdominal pain.  ABDOMEN - 1 VIEW  Comparison: None.  Findings: The bowel gas pattern is normal.  No radiopaque calculi identified.  Right pelvic phlebolith noted.  No evidence of abnormal mass effect.  IMPRESSION: Unremarkable bowel gas pattern.  No acute findings.   Original Report Authenticated By: Myles Rosenthal, M.D.    Ct Abdomen Pelvis W Contrast  08/27/2012  *RADIOLOGY REPORT*  Clinical Data: Lower abdominal pain.  CT ABDOMEN AND PELVIS WITH CONTRAST  Technique:  Multidetector CT imaging of the abdomen and pelvis was performed following the standard protocol during bolus administration of intravenous contrast.  Contrast: 80mL OMNIPAQUE IOHEXOL 300 MG/ML  SOLN  Comparison: None.  Findings: Limited images through the lung bases demonstrate no significant appreciable abnormality. The heart size is within normal limits. No pleural or pericardial effusion.  Unremarkable liver, biliary system, spleen, pancreas, adrenal glands, kidneys.  No hydronephrosis or hydroureter.  Normal appendix.  Sigmoid colon diverticulosis with evidence of diverticulitis.  Contained perforation is suggested with a 3.5 x 3.5 cm air debris collection along the medial margin of the sigmoid colon on image 67.  No free intraperitoneal air elsewhere.  No lymphadenopathy.  Small amount of free fluid dependently within the pelvis.  Thin-walled bladder.  Unremarkable CT appearance to the uterus and adnexa.  And mild degenerative changes of the left hip and lower lumbar spine.  No acute osseous finding.  IMPRESSION: Sigmoid colon diverticulitis with complicating features most in keeping with a contained perforation measuring 3.5 cm.  Discussed via telephone with Dr. Dierdre Highman at 02:30 a.m. on 08/27/2012.   Original  Report Authenticated By: Jearld Lesch, M.D.     Review of Systems  Constitutional: Negative.   HENT: Negative.   Respiratory: Negative.   Gastrointestinal: Positive for nausea, abdominal pain and  constipation.  Genitourinary: Negative.   Musculoskeletal: Negative.   Skin: Negative.    Blood pressure 106/68, pulse 76, temperature 99.1 F (37.3 C), temperature source Oral, resp. rate 20, height 5\' 2"  (1.575 m), weight 186 lb (84.369 kg), last menstrual period 08/19/2012, SpO2 97.00%. Physical Exam  Constitutional: She is oriented to person, place, and time. She appears well-developed and well-nourished.  HENT:  Head: Normocephalic and atraumatic.  Eyes: Conjunctivae normal and EOM are normal. Pupils are equal, round, and reactive to light.  Neck: Normal range of motion. Neck supple.  Cardiovascular: Normal rate and normal heart sounds.   Respiratory: Effort normal and breath sounds normal.  GI: Soft. She exhibits distension. There is tenderness. There is no rebound and no guarding.  Musculoskeletal: Normal range of motion.  Neurological: She is alert and oriented to person, place, and time.    Assessment/Plan: The patient is a 41 year old female with contained perforation of diverticulitis.  1. Admission for Cipro and Flagyl antibiotics 2. Continue n.p.o. Status 3. We'll discuss with interventional radiology regarding possible percutaneous drainage, but there is no surgical intervention planned at this time. 4. We discussed the possibility of her abdominal pain and infection getting worse which could require surgical intervention. 5.Will continue to follow at this time.  Heather [name]., Arnitra Sokoloski 08/27/2012, 5:05 AM

## 2012-08-27 NOTE — Progress Notes (Signed)
In to speak with patient regarding lack of PCP. Patient reports recently moving to this area and starting new job last week. Denies having insurance. General information packet given to patient which contains PCP, medication assistance, and DSS information.

## 2012-08-27 NOTE — ED Notes (Signed)
Patient transported to CT 

## 2012-08-27 NOTE — ED Notes (Signed)
Hospitalist at bedside 

## 2012-08-27 NOTE — Significant Event (Signed)
Dr Craige Cotta CALLED INFORMED OF PT CHANGE IN STATUS TEMP UP FROM 98.8 AT 1430 TO 101.2 AT 1915. PT C/O ABD PAIN ABD INCREASED SWELLING AND HOT . NIGHT NURSE GIVEN REPORT. DR Craige Cotta TO COME AND EVAL PATIENT.

## 2012-08-27 NOTE — ED Notes (Signed)
Pt reports having lower to mid abdominal pain since this past Thursday. States that she has not had a normal bowel movement since last Tuesday. States that she has taken a couple of different kinds of laxatives with no success. Denies nausea or vomiting. Rates abdominal pain 10/10 at this time.

## 2012-08-27 NOTE — Plan of Care (Signed)
Problem: Food- and Nutrition-Related Knowledge Deficit (NB-1.1) Goal: Nutrition education Formal process to instruct or train a patient/client in a skill or to impart knowledge to help patients/clients voluntarily manage or modify food choices and eating behavior to maintain or improve health.  Outcome: Completed/Met Date Met:  08/27/12 Rd consulted for diet education.   Patient Active Problem List  Diagnosis  . Abdominal pain  . Perforated bowel  . Acute diverticulitis  . Colonic diverticular abscess   RD provided pt with "Low Fiber" nutrition therapy hand out from the [employer] of [employer]. Pt encouraged to limit consumption of high fiber foods such as skins and peels of fruits and vegetables, raw vegetables and high fiber grain products. Pt verbalized understanding. Pt asking when she would be allowed to eat again. Did not want to look over hand out at this time.   Body mass index is 72.70 kg/(m^2). Weight on admission was 180 lbs, now 397 lbs (180 kg) expect this is an error and pt actutally 180 lbs with BMI of 34.1 kg/(m^2)- obesity class 1.  Diet: NPO  Chart reviewed, no additional nutrition interventions at this time. Please consult as needed.   Clarene Duke RD, LDN Pager [phone] After Hours pager [phone]

## 2012-08-28 LAB — BASIC METABOLIC PANEL
Calcium: 9 mg/dL (ref 8.4–10.5)
GFR calc Af Amer: >90 mL/min (ref >90)
GFR calc non Af Amer: >90 mL/min (ref >90)
Glucose, Bld: 79 mg/dL (ref 70–99)
Potassium: 3.7 mEq/L (ref 3.5–5.1)
Sodium: 136 mEq/L (ref 135–145)

## 2012-08-28 LAB — CBC
Hemoglobin: 11.3 g/dL — ABNORMAL LOW (ref 12.0–15.0)
MCH: 30.1 pg (ref 26.0–34.0)
MCHC: 34.3 g/dL (ref 30.0–36.0)
Platelets: 357 10*3/uL (ref 150–400)
RBC: 3.75 MIL/uL — ABNORMAL LOW (ref 3.87–5.11)

## 2012-08-28 LAB — PROTIME-INR
INR: 1.29 (ref 0.00–1.49)
Prothrombin Time: 15.8 seconds — ABNORMAL HIGH (ref 11.6–15.2)

## 2012-08-28 NOTE — Progress Notes (Signed)
[facility] PROGRESS NOTE  Logen Fowle ZOX:[id number] DOB: 17-Oct-1970 DOA: 08/26/2012 PCP: Default, Provider, MD  Assessment/Plan: #1 acute diverticulitis complicated with abscess/questionable perforation/colonic diverticular abscess Questionable etiology. Patient does endorse using Levoxyl bowel movements. Patient still with significant abdominal pain and still with fevers. Blood cultures are pending. Continue empiric IV ciprofloxacin and Flagyl. Continue n.p.o. Continue supportive care. If patient continues to spike fevers despite antibiotic therapy will need to reimage the abdomen and pelvis via CT scan. General surgery is following and appreciate input and recommendations.  #2 abdominal pain Secondary to problem #1. See problem #1.  #3 colonic diverticular abscess Continue empiric IV antibiotics. Patient still with fever spikes. If continued fevers will need to repeat CT of the abdomen and pelvis on follow. May need drainage. General surgery is following.  #4 prophylaxis SCDs for DVT prophylaxis.  Code Status: Full Family Communication: Updated patient at bedside. No family present. Disposition Plan: Home when medically stable   Consultants:  General surgery: Dr. Derrell Lolling 08/27/2012  Procedures:  Acute abdominal series 08/27/2012  Abdominal films 08/26/2012  CT of the abdomen and pelvis 08/27/2012  Antibiotics:  IV Cipro 08/27/2012  IV Flagyl 08/27/2012  HPI/Subjective: Patient still complained of severe abdominal pain. Some nausea no emesis.  Objective: Filed Vitals:   08/28/12 0219 08/28/12 0552 08/28/12 1354 08/28/12 1453  BP: 108/74 111/71  102/67  Pulse: 67 84  73  Temp: 98.2 F (36.8 C) 98.3 F (36.8 C) 98 F (36.7 C) 99.9 F (37.7 C)  TempSrc: Oral Oral Oral Oral  Resp: 20 20 20 20   Height:      Weight:  82.6 kg (182 lb 1.6 oz)    SpO2: 96% 96% 96% 96%    Intake/Output Summary (Last 24 hours) at 08/28/12 1511 Last data filed at 08/28/12  1454  Gross per 24 hour  Intake 1843.75 ml  Output   1450 ml  Net 393.75 ml   Filed Weights   08/26/12 1909 08/27/12 0701 08/28/12 0552  Weight: 84.369 kg (186 lb) 180.3 kg (397 lb 7.8 oz) 82.6 kg (182 lb 1.6 oz)    Exam:   General:  NAD  Cardiovascular: RRR. No lower extremity edema.  Respiratory: CTAB  Abdomen: Soft//ND/+BS/tender to palpation in the left lower quadrant.  Data Reviewed: Basic Metabolic Panel:  Lab 08/28/12 8119 08/26/12 1923  NA 136 140  K 3.7 4.0  CL 101 101  CO2 22 26  GLUCOSE 79 118*  BUN 5* 9  CREATININE 0.76 0.92  CALCIUM 9.0 9.9  MG 2.1 --  PHOS -- --   Liver Function Tests:  Lab 08/26/12 1923  AST 18  ALT 9  ALKPHOS 68  BILITOT 0.4  PROT 7.8  ALBUMIN 3.7    Lab 08/27/12 0004  LIPASE 12  AMYLASE --   No results found for this basename: AMMONIA:5 in the last 168 hours CBC:  Lab 08/28/12 0930 08/27/12 2011 08/26/12 1923  WBC 12.8* 11.5* 10.6*  NEUTROABS -- 8.5* 7.6  HGB 11.3* 11.0* 11.8*  HCT 32.9* 32.1* 34.9*  MCV 87.7 86.5 86.8  PLT 357 361 398   Cardiac Enzymes: No results found for this basename: CKTOTAL:5,CKMB:5,CKMBINDEX:5,TROPONINI:5 in the last 168 hours BNP (last 3 results) No results found for this basename: PROBNP:3 in the last 8760 hours CBG: No results found for this basename: GLUCAP:5 in the last 168 hours  No results found for this or any previous visit (from the past 240 hour(s)).   Studies: Dg Abd 1 View  08/26/2012  *RADIOLOGY REPORT*  Clinical Data: Lower abdominal pain.  ABDOMEN - 1 VIEW  Comparison: None.  Findings: The bowel gas pattern is normal.  No radiopaque calculi identified.  Right pelvic phlebolith noted.  No evidence of abnormal mass effect.  IMPRESSION: Unremarkable bowel gas pattern.  No acute findings.   Original Report Authenticated By: Myles Rosenthal, M.D.    Ct Abdomen Pelvis W Contrast  08/27/2012  *RADIOLOGY REPORT*  Clinical Data: Lower abdominal pain.  CT ABDOMEN AND PELVIS WITH  CONTRAST  Technique:  Multidetector CT imaging of the abdomen and pelvis was performed following the standard protocol during bolus administration of intravenous contrast.  Contrast: 80mL OMNIPAQUE IOHEXOL 300 MG/ML  SOLN  Comparison: None.  Findings: Limited images through the lung bases demonstrate no significant appreciable abnormality. The heart size is within normal limits. No pleural or pericardial effusion.  Unremarkable liver, biliary system, spleen, pancreas, adrenal glands, kidneys.  No hydronephrosis or hydroureter.  Normal appendix.  Sigmoid colon diverticulosis with evidence of diverticulitis.  Contained perforation is suggested with a 3.5 x 3.5 cm air debris collection along the medial margin of the sigmoid colon on image 67.  No free intraperitoneal air elsewhere.  No lymphadenopathy.  Small amount of free fluid dependently within the pelvis.  Thin-walled bladder.  Unremarkable CT appearance to the uterus and adnexa.  And mild degenerative changes of the left hip and lower lumbar spine.  No acute osseous finding.  IMPRESSION: Sigmoid colon diverticulitis with complicating features most in keeping with a contained perforation measuring 3.5 cm.  Discussed via telephone with Dr. Dierdre Highman at 02:30 a.m. on 08/27/2012.   Original Report Authenticated By: Jearld Lesch, M.D.    Dg Abd Acute W/chest  08/27/2012  *RADIOLOGY REPORT*  Clinical Data: Perforated bowel  ACUTE ABDOMEN SERIES (ABDOMEN 2 VIEW & CHEST 1 VIEW)  Comparison:   CT 08/27/2012  Findings: Lungs are clear without infiltrate or effusion.  Negative for heart failure.  No free air under the diaphragm.  Contrast has progressed into the colon from the recent CT scan. Negative for bowel obstruction.  No free air.  The CT findings suggest diverticulitis with perforation and abscess.  IMPRESSION: No active cardiopulmonary disease.  No free air in the abdomen.   Original Report Authenticated By: Janeece Riggers, M.D.     Scheduled Meds:   .  ciprofloxacin  400 mg Intravenous Q12H  . metronidazole  500 mg Intravenous Q8H  . sodium chloride  3 mL Intravenous Q12H   Continuous Infusions:   . sodium chloride 125 mL/hr at 08/28/12 1610    Principal Problem:  *Acute diverticulitis Active Problems:  Abdominal pain  Perforated bowel  Colonic diverticular abscess    Time spent: > 35 mins    [facility]  [facility] Pager [phone]. If 8PM-8AM, please contact night-coverage at [email or web address], password [facility] 08/28/2012, 3:11 PM  LOS: 2 days

## 2012-08-28 NOTE — Progress Notes (Addendum)
Patient ID: Heather [name], female   DOB: 1971/05/26, 41 y.o.   MRN: [id number]    Subjective: Continues with diffuse abdominal pain mostly in the midabdomen suprapubic region. No N/V/D. Remains NPO.  Objective: Vital signs in last 24 hours: Temp:  [98.2 F (36.8 C)-101.2 F (38.4 C)] 98.3 F (36.8 C) (12/19 0552) Pulse Rate:  [67-86] 84  (12/19 0552) Resp:  [20-24] 20  (12/19 0552) BP: (108-124)/(68-77) 111/71 mmHg (12/19 0552) SpO2:  [95 %-98 %] 96 % (12/19 0552) Weight:  [182 lb 1.6 oz (82.6 kg)] 182 lb 1.6 oz (82.6 kg) (12/19 0552) Last BM Date: 08/26/12  Intake/Output from previous day: 12/18 0701 - 12/19 0700 In: 2243.8 [I.V.:1543.8; IV Piggyback:700] Out: 825 [Urine:825] Intake/Output this shift:    General appearance: alert, cooperative, appears stated age and mild distress Chest: CTA Cardiac: RRR Abdomen: soft, diffusely tender in mid suprapubic region, + BS, No N/V/D. Extremites: warm to touch, no edema or tenderness, + pulses   Lab Results:   [facility] 08/27/12 2011 08/26/12 1923  WBC 11.5* 10.6*  HGB 11.0* 11.8*  HCT 32.1* 34.9*  PLT 361 398   BMET  Basename 08/26/12 1923  NA 140  K 4.0  CL 101  CO2 26  GLUCOSE 118*  BUN 9  CREATININE 0.92  CALCIUM 9.9   PT/INR No results found for this basename: LABPROT:2,INR:2 in the last 72 hours ABG No results found for this basename: PHART:2,PCO2:2,PO2:2,HCO3:2 in the last 72 hours  Studies/Results: Dg Abd 1 View  08/26/2012  *RADIOLOGY REPORT*  Clinical Data: Lower abdominal pain.  ABDOMEN - 1 VIEW  Comparison: None.  Findings: The bowel gas pattern is normal.  No radiopaque calculi identified.  Right pelvic phlebolith noted.  No evidence of abnormal mass effect.  IMPRESSION: Unremarkable bowel gas pattern.  No acute findings.   Original Report Authenticated By: Myles Rosenthal, M.D.    Ct Abdomen Pelvis W Contrast  08/27/2012  *RADIOLOGY REPORT*  Clinical Data: Lower abdominal pain.  CT ABDOMEN AND PELVIS WITH  CONTRAST  Technique:  Multidetector CT imaging of the abdomen and pelvis was performed following the standard protocol during bolus administration of intravenous contrast.  Contrast: 80mL OMNIPAQUE IOHEXOL 300 MG/ML  SOLN  Comparison: None.  Findings: Limited images through the lung bases demonstrate no significant appreciable abnormality. The heart size is within normal limits. No pleural or pericardial effusion.  Unremarkable liver, biliary system, spleen, pancreas, adrenal glands, kidneys.  No hydronephrosis or hydroureter.  Normal appendix.  Sigmoid colon diverticulosis with evidence of diverticulitis.  Contained perforation is suggested with a 3.5 x 3.5 cm air debris collection along the medial margin of the sigmoid colon on image 67.  No free intraperitoneal air elsewhere.  No lymphadenopathy.  Small amount of free fluid dependently within the pelvis.  Thin-walled bladder.  Unremarkable CT appearance to the uterus and adnexa.  And mild degenerative changes of the left hip and lower lumbar spine.  No acute osseous finding.  IMPRESSION: Sigmoid colon diverticulitis with complicating features most in keeping with a contained perforation measuring 3.5 cm.  Discussed via telephone with Dr. Dierdre Highman at 02:30 a.m. on 08/27/2012.   Original Report Authenticated By: Jearld Lesch, M.D.    Dg Abd Acute W/chest  08/27/2012  *RADIOLOGY REPORT*  Clinical Data: Perforated bowel  ACUTE ABDOMEN SERIES (ABDOMEN 2 VIEW & CHEST 1 VIEW)  Comparison:   CT 08/27/2012  Findings: Lungs are clear without infiltrate or effusion.  Negative for heart failure.  No free  air under the diaphragm.  Contrast has progressed into the colon from the recent CT scan. Negative for bowel obstruction.  No free air.  The CT findings suggest diverticulitis with perforation and abscess.  IMPRESSION: No active cardiopulmonary disease.  No free air in the abdomen.   Original Report Authenticated By: Janeece Riggers, M.D.      Anti-infectives: Anti-infectives     Start     Dose/Rate Route Frequency Ordered Stop   08/27/12 0700   ciprofloxacin (CIPRO) IVPB 400 mg        400 mg 200 mL/hr over 60 Minutes Intravenous Every 12 hours 08/27/12 0651     08/27/12 0700   metroNIDAZOLE (FLAGYL) IVPB 500 mg        500 mg 100 mL/hr over 60 Minutes Intravenous Every 8 hours 08/27/12 0651     08/27/12 0300   ciprofloxacin (CIPRO) IVPB 400 mg        400 mg 200 mL/hr over 60 Minutes Intravenous  Once 08/27/12 0256 08/27/12 0423   08/27/12 0300   metroNIDAZOLE (FLAGYL) IVPB 500 mg        500 mg 100 mL/hr over 60 Minutes Intravenous  Once 08/27/12 0256 08/27/12 0512          Assessment/Plan: s/p * No surgery found *  Patient Active Problem List  Diagnosis  . Abdominal pain  . Perforated bowel  . Acute diverticulitis  . Colonic diverticular abscess  Management per medicine team Continue NPO, IVF abx Will need re-scan of abdomen and pelvis in near future Discussed with patient possibility of surgical intervention if abdominal pain worsens. Will continue to follow at this time.   LOS: 2 days    Golda Acre [facility]  Pager # [phone] 08/28/2012

## 2012-08-28 NOTE — Progress Notes (Signed)
Patient interviewed and examined, agree with PA note above. She still has significant pain and tenderness but no worse than yesterday and she is soft and nontender except in the left lower quadrant.  Continue nonoperative management and close observation. Mariella Saa MD, FACS  08/28/2012 6:29 PM

## 2012-08-29 LAB — CBC
MCHC: 34.1 g/dL (ref 30.0–36.0)
MCV: 85.8 fL (ref 78.0–100.0)
Platelets: 382 10*3/uL (ref 150–400)
RDW: 13.3 % (ref 11.5–15.5)
WBC: 11.5 10*3/uL — ABNORMAL HIGH (ref 4.0–10.5)

## 2012-08-29 NOTE — Progress Notes (Signed)
  Subjective: Still with fever but much less abdominal pain Afraid to pass flatus  Objective: Vital signs in last 24 hours: Temp:  [98 F (36.7 C)-101.6 F (38.7 C)] 98.7 F (37.1 C) (12/20 0457) Pulse Rate:  [73-81] 81  (12/20 0457) Resp:  [18-20] 18  (12/20 0457) BP: (99-117)/(52-76) 114/69 mmHg (12/20 0457) SpO2:  [96 %-99 %] 99 % (12/20 0457) Weight:  [181 lb 7 oz (82.3 kg)] 181 lb 7 oz (82.3 kg) (12/20 0457) Last BM Date: 08/26/12  Intake/Output from previous day: 12/19 0701 - 12/20 0700 In: 4566.3 [P.O.:360; I.V.:3706.3; IV Piggyback:500] Out: 1325 [Urine:1325] Intake/Output this shift:    Abdomen soft, only mild to moderate LLQ tenderness today  Lab Results:   [facility] 08/28/12 0930 08/27/12 2011  WBC 12.8* 11.5*  HGB 11.3* 11.0*  HCT 32.9* 32.1*  PLT 357 361   BMET  Basename 08/28/12 0930 08/26/12 1923  NA 136 140  K 3.7 4.0  CL 101 101  CO2 22 26  GLUCOSE 79 118*  BUN 5* 9  CREATININE 0.76 0.92  CALCIUM 9.0 9.9   PT/INR  Basename 08/28/12 0930  LABPROT 15.8*  INR 1.29   ABG No results found for this basename: PHART:2,PCO2:2,PO2:2,HCO3:2 in the last 72 hours  Studies/Results: Dg Abd Acute W/chest  08/27/2012  *RADIOLOGY REPORT*  Clinical Data: Perforated bowel  ACUTE ABDOMEN SERIES (ABDOMEN 2 VIEW & CHEST 1 VIEW)  Comparison:   CT 08/27/2012  Findings: Lungs are clear without infiltrate or effusion.  Negative for heart failure.  No free air under the diaphragm.  Contrast has progressed into the colon from the recent CT scan. Negative for bowel obstruction.  No free air.  The CT findings suggest diverticulitis with perforation and abscess.  IMPRESSION: No active cardiopulmonary disease.  No free air in the abdomen.   Original Report Authenticated By: Janeece Riggers, M.D.     Anti-infectives: Anti-infectives     Start     Dose/Rate Route Frequency Ordered Stop   08/27/12 0700   ciprofloxacin (CIPRO) IVPB 400 mg        400 mg 200 mL/hr over 60  Minutes Intravenous Every 12 hours 08/27/12 0651     08/27/12 0700   metroNIDAZOLE (FLAGYL) IVPB 500 mg        500 mg 100 mL/hr over 60 Minutes Intravenous Every 8 hours 08/27/12 0651     08/27/12 0300   ciprofloxacin (CIPRO) IVPB 400 mg        400 mg 200 mL/hr over 60 Minutes Intravenous  Once 08/27/12 0256 08/27/12 0423   08/27/12 0300   metroNIDAZOLE (FLAGYL) IVPB 500 mg        500 mg 100 mL/hr over 60 Minutes Intravenous  Once 08/27/12 0256 08/27/12 0512          Assessment/Plan: s/p * No surgery found * Focally perforated diverticulitis  Seems to be improving.  Will continue IV antibiotics and repeat  cbc  LOS: 3 days    Keyira Mondesir A 08/29/2012

## 2012-08-29 NOTE — Progress Notes (Signed)
Utilization review completed.  

## 2012-08-29 NOTE — Progress Notes (Signed)
[facility] PROGRESS NOTE  Heather [name] ZOX:[id number] DOB: 27-Sep-1970 DOA: 08/26/2012 PCP: Default, Provider, MD  Assessment/Plan: #1 acute diverticulitis complicated with abscess/questionable perforation/colonic diverticular abscess Questionable etiology. Patient does endorse using laxatives prior to admission for bowel movements. Patient still with some improvement in abdominal pain and still with fevers yesterday. Blood cultures are pending. Continue empiric IV ciprofloxacin and Flagyl. Continue supportive care. If patient continues to spike fevers despite antibiotic therapy will need to reimage the abdomen and pelvis via CT scan.  Will place on clear liquids.General surgery is following and appreciate input and recommendations.  #2 abdominal pain Secondary to problem #1. See problem #1.  #3 colonic diverticular abscess Continue empiric IV antibiotics. Patient still with fever spikes last fever of 101.6 on 08/28/2012. If continued fevers will need to repeat CT of the abdomen and pelvis on follow. May need drainage. General surgery is following.  #4 prophylaxis SCDs for DVT prophylaxis.  Code Status: Full Family Communication: Updated patient at bedside. No family present. Disposition Plan: Home when medically stable   Consultants:  General surgery: Dr. Derrell Lolling 08/27/2012  Procedures:  Acute abdominal series 08/27/2012  Abdominal films 08/26/2012  CT of the abdomen and pelvis 08/27/2012  Antibiotics:  IV Cipro 08/27/2012  IV Flagyl 08/27/2012  HPI/Subjective: Patient states abdominal pain with some improvement. Some nausea no emesis. Patient asking food.  Objective: Filed Vitals:   08/28/12 1800 08/28/12 1816 08/28/12 2007 08/29/12 0457  BP: 117/76  99/52 114/69  Pulse: 78  75 81  Temp: 99 F (37.2 C) 99.2 F (37.3 C) 98.6 F (37 C) 98.7 F (37.1 C)  TempSrc: Oral Oral Oral Oral  Resp: 18  18 18   Height:      Weight:    82.3 kg (181 lb 7 oz)  SpO2:  99%  99% 99%    Intake/Output Summary (Last 24 hours) at 08/29/12 1051 Last data filed at 08/29/12 0900  Gross per 24 hour  Intake 4566.25 ml  Output   1575 ml  Net 2991.25 ml   Filed Weights   08/27/12 0701 08/28/12 0552 08/29/12 0457  Weight: 180.3 kg (397 lb 7.8 oz) 82.6 kg (182 lb 1.6 oz) 82.3 kg (181 lb 7 oz)    Exam:   General:  NAD  Cardiovascular: RRR. No lower extremity edema.  Respiratory: CTAB  Abdomen: Soft//ND/+BS/ decreased tender to palpation in the left lower quadrant.  Data Reviewed: Basic Metabolic Panel:  Lab 08/28/12 8119 08/26/12 1923  NA 136 140  K 3.7 4.0  CL 101 101  CO2 22 26  GLUCOSE 79 118*  BUN 5* 9  CREATININE 0.76 0.92  CALCIUM 9.0 9.9  MG 2.1 --  PHOS -- --   Liver Function Tests:  Lab 08/26/12 1923  AST 18  ALT 9  ALKPHOS 68  BILITOT 0.4  PROT 7.8  ALBUMIN 3.7    Lab 08/27/12 0004  LIPASE 12  AMYLASE --   No results found for this basename: AMMONIA:5 in the last 168 hours CBC:  Lab 08/29/12 0930 08/28/12 0930 08/27/12 2011 08/26/12 1923  WBC 11.5* 12.8* 11.5* 10.6*  NEUTROABS -- -- 8.5* 7.6  HGB 10.7* 11.3* 11.0* 11.8*  HCT 31.4* 32.9* 32.1* 34.9*  MCV 85.8 87.7 86.5 86.8  PLT 382 357 361 398   Cardiac Enzymes: No results found for this basename: CKTOTAL:5,CKMB:5,CKMBINDEX:5,TROPONINI:5 in the last 168 hours BNP (last 3 results) No results found for this basename: PROBNP:3 in the last 8760 hours CBG: No results found  for this basename: GLUCAP:5 in the last 168 hours  Recent Results (from the past 240 hour(s))  CULTURE, BLOOD (ROUTINE X 2)     Status: Normal (Preliminary result)   Collection Time   08/27/12  8:10 PM      Component Value Range Status Comment   Specimen Description BLOOD RIGHT ARM   Final    Special Requests BOTTLES DRAWN AEROBIC AND ANAEROBIC 10CC   Final    Culture  Setup Time 08/28/2012 04:01   Final    Culture     Final    Value:        BLOOD CULTURE RECEIVED NO GROWTH TO DATE CULTURE  WILL BE HELD FOR 5 DAYS BEFORE ISSUING A FINAL NEGATIVE REPORT   Report Status PENDING   Incomplete   CULTURE, BLOOD (ROUTINE X 2)     Status: Normal (Preliminary result)   Collection Time   08/27/12  8:11 PM      Component Value Range Status Comment   Specimen Description BLOOD RIGHT HAND   Final    Special Requests BOTTLES DRAWN AEROBIC AND ANAEROBIC 10CC   Final    Culture  Setup Time 08/28/2012 04:01   Final    Culture     Final    Value:        BLOOD CULTURE RECEIVED NO GROWTH TO DATE CULTURE WILL BE HELD FOR 5 DAYS BEFORE ISSUING A FINAL NEGATIVE REPORT   Report Status PENDING   Incomplete      Studies: Dg Abd Acute W/chest  08/27/2012  *RADIOLOGY REPORT*  Clinical Data: Perforated bowel  ACUTE ABDOMEN SERIES (ABDOMEN 2 VIEW & CHEST 1 VIEW)  Comparison:   CT 08/27/2012  Findings: Lungs are clear without infiltrate or effusion.  Negative for heart failure.  No free air under the diaphragm.  Contrast has progressed into the colon from the recent CT scan. Negative for bowel obstruction.  No free air.  The CT findings suggest diverticulitis with perforation and abscess.  IMPRESSION: No active cardiopulmonary disease.  No free air in the abdomen.   Original Report Authenticated By: Janeece Riggers, M.D.     Scheduled Meds:    . ciprofloxacin  400 mg Intravenous Q12H  . metronidazole  500 mg Intravenous Q8H  . sodium chloride  3 mL Intravenous Q12H   Continuous Infusions:    . sodium chloride 125 mL/hr at 08/29/12 0231    Principal Problem:  *Acute diverticulitis Active Problems:  Abdominal pain  Perforated bowel  Colonic diverticular abscess    Time spent: > 35 mins    [facility]  [facility] Pager [phone]. If 8PM-8AM, please contact night-coverage at [email or web address], password [facility] 08/29/2012, 10:51 AM  LOS: 3 days

## 2012-08-30 ENCOUNTER — Inpatient Hospital Stay (HOSPITAL_COMMUNITY): Payer: Medicaid Other

## 2012-08-30 LAB — CBC
Hemoglobin: 10.1 g/dL — ABNORMAL LOW (ref 12.0–15.0)
MCH: 29.8 pg (ref 26.0–34.0)
MCHC: 34.6 g/dL (ref 30.0–36.0)
Platelets: 363 10*3/uL (ref 150–400)
RDW: 13.6 % (ref 11.5–15.5)

## 2012-08-30 LAB — BASIC METABOLIC PANEL
Calcium: 8.5 mg/dL (ref 8.4–10.5)
GFR calc Af Amer: >90 mL/min (ref >90)
GFR calc non Af Amer: >90 mL/min (ref >90)
Glucose, Bld: 75 mg/dL (ref 70–99)
Potassium: 3.4 mEq/L — ABNORMAL LOW (ref 3.5–5.1)
Sodium: 135 mEq/L (ref 135–145)

## 2012-08-30 MED ORDER — IBUPROFEN 600 MG PO TABS
600.0000 mg | ORAL_TABLET | Freq: Four times a day (QID) | ORAL | Status: DC | PRN
  Administered 2012-08-30 – 2012-09-02 (×6): 600 mg via ORAL
  Filled 2012-08-30 (×7): qty 1

## 2012-08-30 MED ORDER — IOHEXOL 300 MG/ML  SOLN
100.0000 mL | Freq: Once | INTRAMUSCULAR | Status: AC | PRN
  Administered 2012-08-30: 100 mL via INTRAVENOUS

## 2012-08-30 MED ORDER — ACETAMINOPHEN 325 MG PO TABS
650.0000 mg | ORAL_TABLET | ORAL | Status: DC | PRN
  Administered 2012-08-30 – 2012-09-01 (×5): 650 mg via ORAL
  Filled 2012-08-30 (×7): qty 2

## 2012-08-30 NOTE — Progress Notes (Signed)
Patient ID: Heather [name], female   DOB: 17-Feb-1971, 41 y.o.   MRN: [id number]    Subjective: Felt well yesterday then some recurrent pain and fever last night.  Better again this AM with mild LLQ pain.Marland Kitchen + flatus, no BM  Objective: Vital signs in last 24 hours: Temp:  [99 F (37.2 C)-100.5 F (38.1 C)] 100.5 F (38.1 C) (12/21 0644) Pulse Rate:  [72-84] 72  (12/21 0644) Resp:  [18] 18  (12/21 0644) BP: (102-148)/(54-94) 102/54 mmHg (12/21 0644) SpO2:  [93 %-99 %] 97 % (12/21 0644) Weight:  [181 lb 4.8 oz (82.237 kg)] 181 lb 4.8 oz (82.237 kg) (12/21 0644) Last BM Date: 08/26/12  Intake/Output from previous day: 12/20 0701 - 12/21 0700 In: 3002.1 [I.V.:3002.1] Out: 1250 [Urine:1250] Intake/Output this shift:    General appearance: alert, cooperative, no distress and walking in room GI: abnormal findings:  moderate tenderness in the LLQ  Lab Results:   Basename 08/30/12 0455 08/29/12 0930  WBC 9.3 11.5*  HGB 10.1* 10.7*  HCT 29.2* 31.4*  PLT 363 382   BMET  Basename 08/30/12 0455 08/28/12 0930  NA 135 136  K 3.4* 3.7  CL 102 101  CO2 21 22  GLUCOSE 75 79  BUN 4* 5*  CREATININE 0.65 0.76  CALCIUM 8.5 9.0     Studies/Results: No results found.  Anti-infectives: Anti-infectives     Start     Dose/Rate Route Frequency Ordered Stop   08/27/12 0700   ciprofloxacin (CIPRO) IVPB 400 mg        400 mg 200 mL/hr over 60 Minutes Intravenous Every 12 hours 08/27/12 0651     08/27/12 0700   metroNIDAZOLE (FLAGYL) IVPB 500 mg        500 mg 100 mL/hr over 60 Minutes Intravenous Every 8 hours 08/27/12 0651     08/27/12 0300   ciprofloxacin (CIPRO) IVPB 400 mg        400 mg 200 mL/hr over 60 Minutes Intravenous  Once 08/27/12 0256 08/27/12 0423   08/27/12 0300   metroNIDAZOLE (FLAGYL) IVPB 500 mg        500 mg 100 mL/hr over 60 Minutes Intravenous  Once 08/27/12 0256 08/27/12 7829          Assessment/Plan: Diverticulitis with contained perforation.  Stable/slow  improvement, WBC normalized, but still with fever and some tenderness Would continue current Rx and plan to repeat CT Monday    LOS: 4 days    Deronda Christian T 08/30/2012

## 2012-08-30 NOTE — Progress Notes (Signed)
[facility] PROGRESS NOTE  Heather [name] UJW:[id number] DOB: 1970/11/21 DOA: 08/26/2012 PCP: Default, Provider, MD  Assessment/Plan: #1 acute diverticulitis complicated with abscess/questionable perforation/colonic diverticular abscess Endorses using laxatives prior to admission for bowel movements. Patient still with some improvement in abdominal pain and still with fevers yesterday. Blood cultures from 12/21 are pending. Continue empiric IV ciprofloxacin and Flagyl.  If patient continues to spike fevers despite antibiotic therapy will need to reimage the abdomen and pelvis via CT scan-this has been scheduled for Monday.  Tolerating clear liquids.General surgery is following and appreciate input and recommendations.  #2 abdominal pain Secondary to problem #1. See problem #1.  #3 colonic diverticular abscess Continue empiric IV antibiotics. Patient still with fever spikes last fever of 101.6 on 08/28/2012. If continued fevers will need to repeat CT of the abdomen and pelvis on follow. May need drainage. General surgery is following.  #4 prophylaxis SCDs for DVT prophylaxis.  Code Status: Full Family Communication: Updated patient at bedside. No family present. Disposition Plan: Home when medically stable   Consultants:  General surgery: Dr. Derrell Lolling 08/27/2012  Procedures:  Acute abdominal series 08/27/2012  Abdominal films 08/26/2012  CT of the abdomen and pelvis 08/27/2012  Antibiotics:  IV Cipro 08/27/2012  IV Flagyl 08/27/2012  HPI/Subjective: Still has mild abdominal pain. Was able to ambulate however. She tolerated clear liquids well. No current nausea vomiting. No chest pain. MAXIMUM TEMPERATURE was 101.6  Objective: Filed Vitals:   08/30/12 0644 08/30/12 1142 08/30/12 1250 08/30/12 1351  BP: 102/54 119/67  107/67  Pulse: 72 64  75  Temp: 100.5 F (38.1 C) 99.4 F (37.4 C) 101.6 F (38.7 C) 99.5 F (37.5 C)  TempSrc: Oral Oral Oral Oral  Resp: 18 18   18   Height:      Weight: 82.237 kg (181 lb 4.8 oz)     SpO2: 97% 100%  96%    Intake/Output Summary (Last 24 hours) at 08/30/12 1617 Last data filed at 08/30/12 1451  Gross per 24 hour  Intake 4825.83 ml  Output    700 ml  Net 4125.83 ml   Filed Weights   08/28/12 0552 08/29/12 0457 08/30/12 0644  Weight: 82.6 kg (182 lb 1.6 oz) 82.3 kg (181 lb 7 oz) 82.237 kg (181 lb 4.8 oz)    Exam:   General:  NAD  Cardiovascular: RRR. No lower extremity edema.  Respiratory: CTAB  Abdomen: Soft//ND/+BS/ decreased tender to palpation in the left lower quadrant.  Data Reviewed: Basic Metabolic Panel:  Lab 08/30/12 5621 08/28/12 0930 08/26/12 1923  NA 135 136 140  K 3.4* 3.7 4.0  CL 102 101 101  CO2 21 22 26   GLUCOSE 75 79 118*  BUN 4* 5* 9  CREATININE 0.65 0.76 0.92  CALCIUM 8.5 9.0 9.9  MG -- 2.1 --  PHOS -- -- --   Liver Function Tests:  Lab 08/26/12 1923  AST 18  ALT 9  ALKPHOS 68  BILITOT 0.4  PROT 7.8  ALBUMIN 3.7    Lab 08/27/12 0004  LIPASE 12  AMYLASE --   No results found for this basename: AMMONIA:5 in the last 168 hours CBC:  Lab 08/30/12 0455 08/29/12 0930 08/28/12 0930 08/27/12 2011 08/26/12 1923  WBC 9.3 11.5* 12.8* 11.5* 10.6*  NEUTROABS -- -- -- 8.5* 7.6  HGB 10.1* 10.7* 11.3* 11.0* 11.8*  HCT 29.2* 31.4* 32.9* 32.1* 34.9*  MCV 86.1 85.8 87.7 86.5 86.8  PLT 363 382 357 361 398   Cardiac Enzymes:  No results found for this basename: CKTOTAL:5,CKMB:5,CKMBINDEX:5,TROPONINI:5 in the last 168 hours BNP (last 3 results) No results found for this basename: PROBNP:3 in the last 8760 hours CBG: No results found for this basename: GLUCAP:5 in the last 168 hours  Recent Results (from the past 240 hour(s))  CULTURE, BLOOD (ROUTINE X 2)     Status: Normal (Preliminary result)   Collection Time   08/27/12  8:10 PM      Component Value Range Status Comment   Specimen Description BLOOD RIGHT ARM   Final    Special Requests BOTTLES DRAWN AEROBIC AND  ANAEROBIC 10CC   Final    Culture  Setup Time 08/28/2012 04:01   Final    Culture     Final    Value:        BLOOD CULTURE RECEIVED NO GROWTH TO DATE CULTURE WILL BE HELD FOR 5 DAYS BEFORE ISSUING A FINAL NEGATIVE REPORT   Report Status PENDING   Incomplete   CULTURE, BLOOD (ROUTINE X 2)     Status: Normal (Preliminary result)   Collection Time   08/27/12  8:11 PM      Component Value Range Status Comment   Specimen Description BLOOD RIGHT HAND   Final    Special Requests BOTTLES DRAWN AEROBIC AND ANAEROBIC 10CC   Final    Culture  Setup Time 08/28/2012 04:01   Final    Culture     Final    Value:        BLOOD CULTURE RECEIVED NO GROWTH TO DATE CULTURE WILL BE HELD FOR 5 DAYS BEFORE ISSUING A FINAL NEGATIVE REPORT   Report Status PENDING   Incomplete      Studies: No results found.  Scheduled Meds:    . ciprofloxacin  400 mg Intravenous Q12H  . metronidazole  500 mg Intravenous Q8H  . sodium chloride  3 mL Intravenous Q12H   Continuous Infusions:    . sodium chloride 125 mL/hr (08/30/12 1138)    Principal Problem:  *Acute diverticulitis Active Problems:  Abdominal pain  Perforated bowel  Colonic diverticular abscess    Time spent: > 35 mins    Heather [name]  [facility] Pager [phone]. If 8PM-8AM, please contact night-coverage at [email or web address], password [facility] 08/30/2012, 4:17 PM  [name]: 4 days

## 2012-08-30 NOTE — Progress Notes (Signed)
Patient given both cups of contrast has started drinking first cup at 2010 and will drink the 2nd cup at 2100.   Also spoke with sister who is the patient's POA and is a MD. Would urgently like to speak with the doctor in regards to what the plan is for her sister considering the patient's fever, abd pain, and knowing the results of CT scan. Note placed in sticky notes to notify the doctor. Ibuprofen was notified from pharmacy and given for temp that was 101.2 right at change of shift. Will continue to monitor to end of shift.

## 2012-08-30 NOTE — ED Provider Notes (Signed)
Medical screening examination/treatment/procedure(s) were performed by non-physician practitioner and as supervising physician I was immediately available for consultation/collaboration.  Sunnie Nielsen, MD 08/30/12 [phone]

## 2012-08-30 NOTE — Progress Notes (Signed)
Patient febrile 100.5. Heather [name] Notified.  Tylenol given x 1. No new orders placed.  RN will continue to monitor.  Louretta Parma, RN

## 2012-08-30 NOTE — Progress Notes (Signed)
Pt having elevated temps. Have been giving PRN Tylenol to control. Current temp 101 hr after tylenol. MD paged will continue to monitor and pass on in report

## 2012-08-31 LAB — CBC WITH DIFFERENTIAL/PLATELET
Eosinophils Absolute: 0.1 10*3/uL (ref 0.0–0.7)
Hemoglobin: 10.7 g/dL — ABNORMAL LOW (ref 12.0–15.0)
Lymphocytes Relative: 9 % — ABNORMAL LOW (ref 12–46)
Lymphs Abs: 0.8 10*3/uL (ref 0.7–4.0)
Monocytes Relative: 15 % — ABNORMAL HIGH (ref 3–12)
Neutro Abs: 6.4 10*3/uL (ref 1.7–7.7)
Neutrophils Relative %: 75 % (ref 43–77)
Platelets: 389 10*3/uL (ref 150–400)
RBC: 3.6 MIL/uL — ABNORMAL LOW (ref 3.87–5.11)
WBC: 8.5 10*3/uL (ref 4.0–10.5)

## 2012-08-31 NOTE — Progress Notes (Signed)
[facility] PROGRESS NOTE  Heather [name] RUE:[id number] DOB: May 16, 1971 DOA: 08/26/2012 PCP: Default, Provider, MD  Assessment/Plan: #1 acute diverticulitis complicated with abscess/questionable perforation/colonic diverticular abscess Endorses using laxatives prior to admission for bowel movements. Patient still with some improvement in abdominal pain and still with fevers yesterday. Blood cultures from 12/21 are pending. Continue empiric IV ciprofloxacin and Flagyl. CT scan performed 1221 shows abscess. General surgery's opinion is that patient will ultimately need hemicolectomy with diverting colostomy-please see Dr. Jamse Mead note from this morning.   #2 abdominal pain Secondary to problem #1. See problem #1.  #3 colonic diverticular abscess Continue empiric IV antibiotics. Patient still with fever spikes last fever of 101.6 on 08/28/2012.\  #4 prophylaxis SCDs for DVT prophylaxis.  Code Status: Full Family Communication: Updated patient at bedside. No family present. Disposition Plan: Home when medically stable   Consultants:  General surgery: Dr. Derrell Lolling 08/27/2012  Procedures:  Acute abdominal series 08/27/2012  Abdominal films 08/26/2012  CT of the abdomen and pelvis 08/27/2012  Antibiotics:  IV Cipro 08/27/2012  IV Flagyl 08/27/2012  HPI/Subjective: Still has mild abdominal pain. Was able to ambulate however. She tolerated clear liquids well. No current nausea vomiting. No chest pain. MAXIMUM TEMPERATURE was 101.6-she was very upset at the fact that she might need surgery. She requests off the floor privileges which was granted  Objective: Filed Vitals:   08/30/12 2122 08/30/12 2334 08/31/12 0601 08/31/12 1309  BP: 112/76  103/61 98/61  Pulse: 75  76 77  Temp: 100.2 F (37.9 C) 99.5 F (37.5 C) 100.1 F (37.8 C)   TempSrc: Oral  Oral   Resp: 19  18 18   Height:      Weight:   83.3 kg (183 lb 10.3 oz)   SpO2: 97%  97% 99%    Intake/Output  Summary (Last 24 hours) at 08/31/12 1508 Last data filed at 08/31/12 1309  Gross per 24 hour  Intake    420 ml  Output   1425 ml  Net  -1005 ml   Filed Weights   08/29/12 0457 08/30/12 0644 08/31/12 0601  Weight: 82.3 kg (181 lb 7 oz) 82.237 kg (181 lb 4.8 oz) 83.3 kg (183 lb 10.3 oz)    Exam:   General:  NAD  Cardiovascular: RRR. No lower extremity edema.  Respiratory: CTAB  Abdomen: Soft//ND/+BS/ decreased tender to palpation in the left lower quadrant.  Data Reviewed: Basic Metabolic Panel:  Lab 08/30/12 1478 08/28/12 0930 08/26/12 1923  NA 135 136 140  K 3.4* 3.7 4.0  CL 102 101 101  CO2 21 22 26   GLUCOSE 75 79 118*  BUN 4* 5* 9  CREATININE 0.65 0.76 0.92  CALCIUM 8.5 9.0 9.9  MG -- 2.1 --  PHOS -- -- --   Liver Function Tests:  Lab 08/26/12 1923  AST 18  ALT 9  ALKPHOS 68  BILITOT 0.4  PROT 7.8  ALBUMIN 3.7    Lab 08/27/12 0004  LIPASE 12  AMYLASE --   No results found for this basename: AMMONIA:5 in the last 168 hours CBC:  Lab 08/31/12 0615 08/30/12 0455 08/29/12 0930 08/28/12 0930 08/27/12 2011 08/26/12 1923  WBC 8.5 9.3 11.5* 12.8* 11.5* --  NEUTROABS 6.4 -- -- -- 8.5* 7.6  HGB 10.7* 10.1* 10.7* 11.3* 11.0* --  HCT 30.8* 29.2* 31.4* 32.9* 32.1* --  MCV 85.6 86.1 85.8 87.7 86.5 --  PLT 389 363 382 357 361 --   Cardiac Enzymes: No results found for this  basename: CKTOTAL:5,CKMB:5,CKMBINDEX:5,TROPONINI:5 in the last 168 hours BNP (last 3 results) No results found for this basename: PROBNP:3 in the last 8760 hours CBG: No results found for this basename: GLUCAP:5 in the last 168 hours  Recent Results (from the past 240 hour(s))  CULTURE, BLOOD (ROUTINE X 2)     Status: Normal (Preliminary result)   Collection Time   08/27/12  8:10 PM      Component Value Range Status Comment   Specimen Description BLOOD RIGHT ARM   Final    Special Requests BOTTLES DRAWN AEROBIC AND ANAEROBIC 10CC   Final    Culture  Setup Time 08/28/2012 04:01    Final    Culture     Final    Value:        BLOOD CULTURE RECEIVED NO GROWTH TO DATE CULTURE WILL BE HELD FOR 5 DAYS BEFORE ISSUING A FINAL NEGATIVE REPORT   Report Status PENDING   Incomplete   CULTURE, BLOOD (ROUTINE X 2)     Status: Normal (Preliminary result)   Collection Time   08/27/12  8:11 PM      Component Value Range Status Comment   Specimen Description BLOOD RIGHT HAND   Final    Special Requests BOTTLES DRAWN AEROBIC AND ANAEROBIC 10CC   Final    Culture  Setup Time 08/28/2012 04:01   Final    Culture     Final    Value:        BLOOD CULTURE RECEIVED NO GROWTH TO DATE CULTURE WILL BE HELD FOR 5 DAYS BEFORE ISSUING A FINAL NEGATIVE REPORT   Report Status PENDING   Incomplete      Studies: Ct Abdomen Pelvis W Contrast  08/31/2012  *RADIOLOGY REPORT*  Clinical Data: Mid abdominal pain.  Nausea.  Recent diagnosis of diverticulitis with abscess.  CT ABDOMEN AND PELVIS WITH CONTRAST  Technique:  Multidetector CT imaging of the abdomen and pelvis was performed following the standard protocol during bolus administration of intravenous contrast.  Contrast: OMNIPAQUE IOHEXOL 300 MG/ML. Oral contrast was also administered.  Comparison: CT abdomen and pelvis 08/27/2012.  Findings: Edema/inflammation surrounding the proximal sigmoid colon in an area of extensive diverticulosis, associated with wall thickening.  The abscess has increased slightly in size in the interval, current measurements approximating 5.6 x 4.2 x 4.9 cm (series 5, image 66 and coronal image 95), previously approximately 4.2 x 3.6 x 4.1 cm.  No new pelvic abscess. No free intraperitoneal air.  No ascites.  Scattered diverticula in the descending colon.  Remainder of the colon normal in appearance.  Normal-appearing small bowel and stomach.  Normal appendix in the right mid abdomen, as the cecum is positioned in the right upper quadrant.  Normal appearing liver, spleen, pancreas, adrenal glands, and kidneys.  Gallbladder  unremarkable by CT.  No biliary ductal dilation.  No visible aorto-iliofemoral atherosclerosis.  No significant lymphadenopathy.  Uterus and ovaries normal in appearance.  Phleboliths low in the pelvis.  Urinary bladder decompressed and unremarkable.  Visualized lung bases clear.  Heart size normal.  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  IMPRESSION:  1.  Interval slight increase in size of the diverticular abscess in the central portion of the lower pelvis, adjacent to the proximal sigmoid colon.  No free intraperitoneal air.  No new abscess. 2.  No new/acute or significant abnormalities otherwise.   Original Report Authenticated By: Hulan Saas, M.D.  Scheduled Meds:    . ciprofloxacin  400 mg Intravenous Q12H  . metronidazole  500 mg Intravenous Q8H  . sodium chloride  3 mL Intravenous Q12H   Continuous Infusions:    . sodium chloride 125 mL/hr (08/31/12 1328)    Principal Problem:  *Acute diverticulitis Active Problems:  Abdominal pain  Perforated bowel  Colonic diverticular abscess    Time spent: > 35 mins    Rhetta Mura  [facility] Pager [phone]. If 8PM-8AM, please contact night-coverage at [email or web address], password [facility] 08/31/2012, 3:08 PM  LOS: 5 days

## 2012-08-31 NOTE — Progress Notes (Signed)
Patient ID: Heather [name], female   DOB: [birth year], 41 y.o.   MRN: [id number]    Subjective: Has had intermittent left lower quadrant pain and fever.` Was worse last night but better again this morning.  Objective: Vital signs in last 24 hours: Temp:  [99.4 F (37.4 C)-101.8 F (38.8 C)] 100.1 F (37.8 C) (12/22 0601) Pulse Rate:  [64-76] 76  (12/22 0601) Resp:  [18-19] 18  (12/22 0601) BP: (103-123)/(61-76) 103/61 mmHg (12/22 0601) SpO2:  [96 %-100 %] 97 % (12/22 0601) Weight:  [183 lb 10.3 oz (83.3 kg)] 183 lb 10.3 oz (83.3 kg) (12/22 0601) Last BM Date: 08/30/12  Intake/Output from previous day: 12/21 0701 - 12/22 0700 In: 2023.8 [P.O.:480; I.V.:943.8; IV Piggyback:600] Out: 1026 [Urine:1025; Stool:1] Intake/Output this shift:    General appearance: alert, cooperative and no distress GI: abnormal findings:  moderate tenderness in the LLQ  Lab Results:   [facility] 08/31/12 0615 08/30/12 0455  WBC 8.5 9.3  HGB 10.7* 10.1*  HCT 30.8* 29.2*  PLT 389 363   BMET  Basename 08/30/12 0455 08/28/12 0930  NA 135 136  K 3.4* 3.7  CL 102 101  CO2 21 22  GLUCOSE 75 79  BUN 4* 5*  CREATININE 0.65 0.76  CALCIUM 8.5 9.0     Studies/Results: Ct Abdomen Pelvis W Contrast  08/31/2012  *RADIOLOGY REPORT*  Clinical Data: Mid abdominal pain.  Nausea.  Recent diagnosis of diverticulitis with abscess.  CT ABDOMEN AND PELVIS WITH CONTRAST  Technique:  Multidetector CT imaging of the abdomen and pelvis was performed following the standard protocol during bolus administration of intravenous contrast.  Contrast: OMNIPAQUE IOHEXOL 300 MG/ML. Oral contrast was also administered.  Comparison: CT abdomen and pelvis 08/27/2012.  Findings: Edema/inflammation surrounding the proximal sigmoid colon in an area of extensive diverticulosis, associated with wall thickening.  The abscess has increased slightly in size in the interval, current measurements approximating 5.6 x 4.2 x 4.9 cm (series 5,  image 66 and coronal image 95), previously approximately 4.2 x 3.6 x 4.1 cm.  No new pelvic abscess. No free intraperitoneal air.  No ascites.  Scattered diverticula in the descending colon.  Remainder of the colon normal in appearance.  Normal-appearing small bowel and stomach.  Normal appendix in the right mid abdomen, as the cecum is positioned in the right upper quadrant.  Normal appearing liver, spleen, pancreas, adrenal glands, and kidneys.  Gallbladder unremarkable by CT.  No biliary ductal dilation.  No visible aorto-iliofemoral atherosclerosis.  No significant lymphadenopathy.  Uterus and ovaries normal in appearance.  Phleboliths low in the pelvis.  Urinary bladder decompressed and unremarkable.  Visualized lung bases clear.  Heart size normal.  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  IMPRESSION:  1.  Interval slight increase in size of the diverticular abscess in the central portion of the lower pelvis, adjacent to the proximal sigmoid colon.  No free intraperitoneal air.  No new abscess. 2.  No new/acute or significant abnormalities otherwise.   Original Report Authenticated By: Hulan Saas, M.D.     Anti-infectives: Anti-infectives     Start     Dose/Rate Route Frequency Ordered Stop   08/27/12 0700   ciprofloxacin (CIPRO) IVPB 400 mg        400 mg 200 mL/hr over 60 Minutes Intravenous Every 12 hours 08/27/12 0651     08/27/12 0700   metroNIDAZOLE (FLAGYL) IVPB 500 mg  500 mg 100 mL/hr over 60 Minutes Intravenous Every 8 hours 08/27/12 0651     08/27/12 0300   ciprofloxacin (CIPRO) IVPB 400 mg        400 mg 200 mL/hr over 60 Minutes Intravenous  Once 08/27/12 0256 08/27/12 0423   08/27/12 0300   metroNIDAZOLE (FLAGYL) IVPB 500 mg        500 mg 100 mL/hr over 60 Minutes Intravenous  Once 08/27/12 0256 08/27/12 1610          Assessment/Plan: Sigmoid diverticulitis with perforation and abscess She is  stable and improved clinically from admission but continues to have fever and pain and tenderness. Repeat CT scan has shown persistence and actually some slight enlargement of her central pelvic abscess.  I discussed these findings at length with the patient. I believe that she is going to need colectomy. This may well result in a temporary colostomy. She is quite upset that the situation but understands. I discussed the situation with her sister who is a PhD in [place] working in oncology research.  Continue IV antibiotics for now. We could ask interventional radiology to take another look at her CT but they did not feel that the abscess was drainable initially. Likely to proceed with exploration and colectomy early this week.    LOS: 5 days    Jacquelina Hewins T 08/31/2012

## 2012-08-31 NOTE — Plan of Care (Signed)
Problem: Phase II Progression Outcomes Goal: Vital signs remain stable Outcome: Progressing At change of shift patient spiked a temp. Ibuprofen given, temp is now 99.5

## 2012-08-31 NOTE — Progress Notes (Signed)
Temp is slightly creeping back up and pt has requested to have Ibuprofen given. Notified from pharmacy and med given per PRN order. Will recheck in hour. Also patient was shivering and nausea and vomiting at this time and was having pain in same spot of the abdomen. Pain med and nausea med given per PRN order. Sister, who is her POA called this morning for an update on the patient and CT results. Explained not able to give results over phone and explained what was going this morning, noted above. Patient is beginning to have some relief but is still in pain, will continue to monitor as needed to end of shift.

## 2012-09-01 ENCOUNTER — Encounter (HOSPITAL_COMMUNITY): Payer: Self-pay | Admitting: Radiology

## 2012-09-01 ENCOUNTER — Inpatient Hospital Stay (HOSPITAL_COMMUNITY): Payer: Medicaid Other

## 2012-09-01 ENCOUNTER — Telehealth (INDEPENDENT_AMBULATORY_CARE_PROVIDER_SITE_OTHER): Payer: Self-pay | Admitting: General Surgery

## 2012-09-01 MED ORDER — POTASSIUM CHLORIDE IN NACL 40-0.9 MEQ/L-% IV SOLN
INTRAVENOUS | Status: DC
  Administered 2012-09-01 – 2012-09-02 (×3): via INTRAVENOUS
  Filled 2012-09-01 (×7): qty 1000

## 2012-09-01 MED ORDER — MIDAZOLAM HCL 2 MG/2ML IJ SOLN
INTRAMUSCULAR | Status: AC | PRN
  Administered 2012-09-01 (×3): 1 mg via INTRAVENOUS

## 2012-09-01 MED ORDER — FENTANYL CITRATE 0.05 MG/ML IJ SOLN
INTRAMUSCULAR | Status: AC | PRN
  Administered 2012-09-01 (×3): 50 ug via INTRAVENOUS

## 2012-09-01 MED ORDER — FENTANYL CITRATE 0.05 MG/ML IJ SOLN
INTRAMUSCULAR | Status: AC
  Filled 2012-09-01: qty 4

## 2012-09-01 MED ORDER — MIDAZOLAM HCL 2 MG/2ML IJ SOLN
INTRAMUSCULAR | Status: AC
  Filled 2012-09-01: qty 4

## 2012-09-01 NOTE — Procedures (Signed)
Ct guided pelvic abscess drain 25cc bloody pus fluid aspirated No comp Sent for gs cx

## 2012-09-01 NOTE — Progress Notes (Addendum)
[facility] PROGRESS NOTE  Ashleen Demma ZOX:[id number] DOB: 09-11-1970 DOA: 08/26/2012 PCP: Default, Provider, MD  Assessment/Plan: Principal Problem:  *Acute diverticulitis Active Problems:  Abdominal pain  Perforated bowel  Colonic diverticular abscess    1.  Acute diverticulitis complicated with abscess/questionable perforation/colonic diverticular abscess: Patient presented abdominal pain, a leukocytosis, and radiologically confirmed acute sigmoid diverticulitis, complicated by microperforation. Managing with iv Ciprofloxacin/Flagyl. Surgical consultation was initally provided by Dr Axel Filler, and patient ws subsequently seen by Dr Glenna Fellows, and Dr Ray Church. Blood cultures have remained negative so far, patient continues to spike occasional, pyrexia, although wcc has trended down. Repeat abdominal/pelvic CT of 08/31/12, showed interval increase in abscess, and patient is s/p CT-Guided pelvic drain, by Dr Jetty Peeks today. General surgery's opinion is that patient may ultimately need hemicolectomy with diverting colostomy. Observation continues.    Code Status: Full Code.  Family Communication: Discussed in detail today, with patient's sister Bosie Clos, via telephone: [phone].  Disposition Plan: Per surgical team.    Brief narrative: 41 yo healthy female comes in with several days of generalized abd pain mainly in lower quadrants with loose nonbloody stools. No n/v. No fevers. Overall healthy. Ct shows diverticulitis with absess/small perf. No recent abx.Admitted for further management.    Consultants:  Dr Axel Filler, surgeon.   Dr Jetty Peeks, interventional radiologist.   Procedures:  Abdominal X-Rays.  Serial CT abdomen/pelvis.   Antibiotics:  Ciprofloxacin 08/26/12>>>  Flagyl 08/26/12>>>  HPI/Subjective: No new issues. Still has abdominal pain.   Objective: Vital signs in last 24 hours: Temp:  [98.4 F (36.9 C)-100.4 F  (38 C)] 100 F (37.8 C) (12/23 1411) Pulse Rate:  [66-82] 68  (12/23 1411) Resp:  [12-22] 16  (12/23 1411) BP: (110-146)/(55-89) 129/79 mmHg (12/23 1411) SpO2:  [97 %-100 %] 100 % (12/23 1411) Weight:  [82.963 kg (182 lb 14.4 oz)] 82.963 kg (182 lb 14.4 oz) (12/23 0619) Weight change: -0.337 kg (-11.9 oz) Last BM Date: 09/01/12  Intake/Output from previous day: 12/22 0701 - 12/23 0700 In: 243 [P.O.:240; I.V.:3] Out: 400 [Urine:400] Total I/O In: 290 [P.O.:290] Out: -    Physical Exam: General: Not in acute distress, alert, communicative, fully oriented, not short of breath at rest.  HEENT:  Mild clinical pallor, no jaundice, no conjunctival injection or discharge. NECK:  Supple, JVP not seen, no carotid bruits, no palpable lymphadenopathy, no palpable goiter. CHEST:  Clinically clear to auscultation, no wheezes, no crackles. HEART:  Sounds 1 and 2 heard, normal, regular, no murmurs. ABDOMEN:  Full, soft, tender in lower abdomen, bowel sounds heard. Perc drainage tube is in situ, under clean dry dressings. . GENITALIA:  Not examined. LOWER EXTREMITIES:  No pitting edema, palpable peripheral pulses. MUSCULOSKELETAL SYSTEM:  Unremarkable.  CENTRAL NERVOUS SYSTEM:  No focal neurologic deficit on gross examination.  Lab Results:  [facility] 08/31/12 0615 08/30/12 0455  WBC 8.5 9.3  HGB 10.7* 10.1*  HCT 30.8* 29.2*  PLT 389 363    Basename 08/30/12 0455  NA 135  K 3.4*  CL 102  CO2 21  GLUCOSE 75  BUN 4*  CREATININE 0.65  CALCIUM 8.5   Recent Results (from the past 240 hour(s))  CULTURE, BLOOD (ROUTINE X 2)     Status: Normal (Preliminary result)   Collection Time   08/27/12  8:10 PM      Component Value Range Status Comment   Specimen Description BLOOD RIGHT ARM   Final    Special Requests BOTTLES DRAWN AEROBIC  AND ANAEROBIC 10CC   Final    Culture  Setup Time 08/28/2012 04:01   Final    Culture     Final    Value:        BLOOD CULTURE RECEIVED NO GROWTH TO DATE  CULTURE WILL BE HELD FOR 5 DAYS BEFORE ISSUING A FINAL NEGATIVE REPORT   Report Status PENDING   Incomplete   CULTURE, BLOOD (ROUTINE X 2)     Status: Normal (Preliminary result)   Collection Time   08/27/12  8:11 PM      Component Value Range Status Comment   Specimen Description BLOOD RIGHT HAND   Final    Special Requests BOTTLES DRAWN AEROBIC AND ANAEROBIC 10CC   Final    Culture  Setup Time 08/28/2012 04:01   Final    Culture     Final    Value:        BLOOD CULTURE RECEIVED NO GROWTH TO DATE CULTURE WILL BE HELD FOR 5 DAYS BEFORE ISSUING A FINAL NEGATIVE REPORT   Report Status PENDING   Incomplete      Studies/Results: Ct Guided Abscess Drain  09/01/2012  *RADIOLOGY REPORT*  Clinical Data: Enlarging diverticular pelvic abscess  CT FLUOROSCOPY GUIDED DIVERTICULAR PELVIC ABSCESS DRAIN INSERTION  Date:  09/01/2012 11:15:00  Radiologist:  Judie Petit. Ruel Favors, M.D.  Medications:  100 mcg Fentanyl, 3 mg Versed  Guidance:  CT fluoroscopy  Fluoroscopy time:  10 seconds  Sedation time:  23 minutes  Contrast volume:  100 ml Omnipaque-300  Complications:  No immediate  PROCEDURE/FINDINGS:  Informed consent was obtained from the patient following explanation of the procedure, risks, benefits and alternatives. The patient understands, agrees and consents for the procedure. All questions were addressed.  A time out was performed.  Maximal barrier sterile technique utilized including caps, mask, sterile gowns, sterile gloves, large sterile drape, hand hygiene, and betadine  Previous imaging reviewed.  The patient was positioned prone. Initially noncontrast localization CT performed.  There is difficulty in delineating the pelvic abscess in relation to the adjacent colon.  100 ml contrast administered.  Repeat pelvis CT performed.  Small central diverticular abscess is better visualized.  Right transgluteal area was marked externally.  Under sterile conditions and local anesthesia, a 17 gauge 16.8 cm access  needle was advanced from a right trans gluteal approach under CT fluoroscopy into the collection.  Needle position confirmed with CT fluoroscopy.  Syringe aspiration yielded 20 ml exudative fluid. Sample sent Gram stain culture.  Guide wire advanced.  Guide wire position was reconfirmed with CT fluoroscopy.  Tract dilatation performed to insert a 10-French drain with the retention loop formed in the abscess cavity.  This was reconfirmed with CT imaging.  Syringe aspiration yielded 25 ml bloody exudative fluid. Abscess cavity was completely decompressed.  No significant residual collection.  Catheter secured with a Prolene suture and connected to external suction bulb.  Sterile dressing applied to the site.  No immediate complication.  The patient tolerated the procedure well.  IMPRESSION:  Successful CT fluoroscopy guided diverticular pelvic abscess drain insertion.   Original Report Authenticated By: Judie Petit. Miles Costain, M.D.    Ct Abdomen Pelvis W Contrast  08/31/2012  *RADIOLOGY REPORT*  Clinical Data: Mid abdominal pain.  Nausea.  Recent diagnosis of diverticulitis with abscess.  CT ABDOMEN AND PELVIS WITH CONTRAST  Technique:  Multidetector CT imaging of the abdomen and pelvis was performed following the standard protocol during bolus administration of intravenous contrast.  Contrast: OMNIPAQUE  IOHEXOL 300 MG/ML. Oral contrast was also administered.  Comparison: CT abdomen and pelvis 08/27/2012.  Findings: Edema/inflammation surrounding the proximal sigmoid colon in an area of extensive diverticulosis, associated with wall thickening.  The abscess has increased slightly in size in the interval, current measurements approximating 5.6 x 4.2 x 4.9 cm (series 5, image 66 and coronal image 95), previously approximately 4.2 x 3.6 x 4.1 cm.  No new pelvic abscess. No free intraperitoneal air.  No ascites.  Scattered diverticula in the descending colon.  Remainder of the colon normal in appearance.  Normal-appearing  small bowel and stomach.  Normal appendix in the right mid abdomen, as the cecum is positioned in the right upper quadrant.  Normal appearing liver, spleen, pancreas, adrenal glands, and kidneys.  Gallbladder unremarkable by CT.  No biliary ductal dilation.  No visible aorto-iliofemoral atherosclerosis.  No significant lymphadenopathy.  Uterus and ovaries normal in appearance.  Phleboliths low in the pelvis.  Urinary bladder decompressed and unremarkable.  Visualized lung bases clear.  Heart size normal.  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  IMPRESSION:  1.  Interval slight increase in size of the diverticular abscess in the central portion of the lower pelvis, adjacent to the proximal sigmoid colon.  No free intraperitoneal air.  No new abscess. 2.  No new/acute or significant abnormalities otherwise.   Original Report Authenticated By: Hulan Saas, M.D.     Medications: Scheduled Meds:   . ciprofloxacin  400 mg Intravenous Q12H  . fentaNYL      . metronidazole  500 mg Intravenous Q8H  . midazolam      . sodium chloride  3 mL Intravenous Q12H   Continuous Infusions:   . 0.9 % NaCl with KCl 40 mEq / L 125 mL/hr at 09/01/12 1459   PRN Meds:.acetaminophen, HYDROmorphone (DILAUDID) injection, ibuprofen, ondansetron (ZOFRAN) IV, ondansetron, oxyCODONE    LOS: 6 days   Leilanny Fluitt,CHRISTOPHER  [facility] Pager [phone]. If 8PM-8AM, please contact night-coverage at [email or web address], password [facility] 09/01/2012, 5:02 PM  LOS: 6 days

## 2012-09-01 NOTE — H&P (Signed)
Heather [name] is an 41 y.o. female.   Chief Complaint: diveticulits Diverticular abscess Scheduled for drain placement now HPI: abd pain; divertic abscess; fever  History reviewed. No pertinent past medical history.  Past Surgical History  Procedure Date  . Cesarean section   . No past surgeries     History reviewed. No pertinent family history. Social History:  reports that she has never smoked. She does not have any smokeless tobacco history on file. She reports that she drinks alcohol. Her drug history not on file.  Allergies: No Known Allergies  Medications Prior to Admission  Medication Sig Dispense Refill  . Bisacodyl (CORRECTOL PO) Take 1-2 tablets by mouth daily as needed. constipation        Results for orders placed during the hospital encounter of 08/26/12 (from the past 48 hour(s))  CBC WITH DIFFERENTIAL     Status: Abnormal   Collection Time   08/31/12  6:15 AM      Component Value Range Comment   WBC 8.5  4.0 - 10.5 K/uL    RBC 3.60 (*) 3.87 - 5.11 MIL/uL    Hemoglobin 10.7 (*) 12.0 - 15.0 g/dL    HCT 16.1 (*) 09.6 - 46.0 %    MCV 85.6  78.0 - 100.0 fL    MCH 29.7  26.0 - 34.0 pg    MCHC 34.7  30.0 - 36.0 g/dL    RDW 04.5  40.9 - 81.1 %    Platelets 389  150 - 400 K/uL    Neutrophils Relative 75  43 - 77 %    Neutro Abs 6.4  1.7 - 7.7 K/uL    Lymphocytes Relative 9 (*) 12 - 46 %    Lymphs Abs 0.8  0.7 - 4.0 K/uL    Monocytes Relative 15 (*) 3 - 12 %    Monocytes Absolute 1.3 (*) 0.1 - 1.0 K/uL    Eosinophils Relative 1  0 - 5 %    Eosinophils Absolute 0.1  0.0 - 0.7 K/uL    Basophils Relative 0  0 - 1 %    Basophils Absolute 0.0  0.0 - 0.1 K/uL    Ct Abdomen Pelvis W Contrast  08/31/2012  *RADIOLOGY REPORT*  Clinical Data: Mid abdominal pain.  Nausea.  Recent diagnosis of diverticulitis with abscess.  CT ABDOMEN AND PELVIS WITH CONTRAST  Technique:  Multidetector CT imaging of the abdomen and pelvis was performed following the standard protocol during  bolus administration of intravenous contrast.  Contrast: OMNIPAQUE IOHEXOL 300 MG/ML. Oral contrast was also administered.  Comparison: CT abdomen and pelvis 08/27/2012.  Findings: Edema/inflammation surrounding the proximal sigmoid colon in an area of extensive diverticulosis, associated with wall thickening.  The abscess has increased slightly in size in the interval, current measurements approximating 5.6 x 4.2 x 4.9 cm (series 5, image 66 and coronal image 95), previously approximately 4.2 x 3.6 x 4.1 cm.  No new pelvic abscess. No free intraperitoneal air.  No ascites.  Scattered diverticula in the descending colon.  Remainder of the colon normal in appearance.  Normal-appearing small bowel and stomach.  Normal appendix in the right mid abdomen, as the cecum is positioned in the right upper quadrant.  Normal appearing liver, spleen, pancreas, adrenal glands, and kidneys.  Gallbladder unremarkable by CT.  No biliary ductal dilation.  No visible aorto-iliofemoral atherosclerosis.  No significant lymphadenopathy.  Uterus and ovaries normal in appearance.  Phleboliths low in the pelvis.  Urinary bladder decompressed and  unremarkable.  Visualized lung bases clear.  Heart size normal.  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  IMPRESSION:  1.  Interval slight increase in size of the diverticular abscess in the central portion of the lower pelvis, adjacent to the proximal sigmoid colon.  No free intraperitoneal air.  No new abscess. 2.  No new/acute or significant abnormalities otherwise.   Original Report Authenticated By: Hulan Saas, M.D.     Review of Systems  Constitutional: Positive for fever and chills.  Respiratory: Negative for shortness of breath.   Cardiovascular: Negative for chest pain.  Gastrointestinal: Positive for nausea and abdominal pain. Negative for vomiting.  Neurological: Positive for weakness and headaches.     Blood pressure 125/80, pulse 82, temperature 100.4 F (38 C), temperature source Oral, resp. rate 22, height 5\' 2"  (1.575 m), weight 182 lb 14.4 oz (82.963 kg), last menstrual period 08/19/2012, SpO2 97.00%. Physical Exam  Constitutional: She is oriented to person, place, and time. She appears well-nourished.  Cardiovascular: Normal rate and regular rhythm.   No murmur heard. Respiratory: Effort normal and breath sounds normal. She has no wheezes.  GI: Soft. Bowel sounds are normal. There is tenderness.  Musculoskeletal: Normal range of motion.  Neurological: She is alert and oriented to person, place, and time.  Psychiatric: She has a normal mood and affect. Her behavior is normal. Judgment and thought content normal.     Assessment/Plan diverticular abscess abd pain; fever Scheduled for abscess drain placement Pt aware of procedure benefits and risks and agreeable to proceed Consent signed and in chart  Chao Blazejewski A 09/01/2012, 10:01 AM

## 2012-09-01 NOTE — Plan of Care (Signed)
Problem: Phase I Progression Outcomes Goal: Other Phase I Outcomes/Goals Explanation of diagnostic test and procedure . Verbalized unserstanding

## 2012-09-01 NOTE — Progress Notes (Signed)
1430 back from IR by bed for s/p drainage and drainage cathter insertion to R hip area Pt a/a//o  Pain tolerable trhs time

## 2012-09-01 NOTE — Telephone Encounter (Signed)
Pt's sister (in [place]) called for update on pt status, so she can make plans to come to [place] to help with her care.  Per notes in Epic, the CT-guided drain was successfully placed.  Dr. Jamey Ripa stated he will make a decision on surgery or not after he rounds on her in the morning.  Sister will check back in the mid-morning tomorrow.

## 2012-09-01 NOTE — Progress Notes (Signed)
Acute diverticulitis  Assessment: Acute diverticulitis Diverticulitis with abscess, stable,but not improving:psooibly amenable now to perc drain  Plan: Will schedule CT guided abscess drainage; discussed with patient and she is agreeable. She understands that it may not be feasible to do, and she then will likely need surgery, colostomy and colectomy   Subjective: "Bad" night with more LLQ pain. No nausea. No other issues. Says she is "ready for anything" we need to do  Objective: Vital signs in last 24 hours: Temp:  [98.4 F (36.9 C)-100.2 F (37.9 C)] 98.4 F (36.9 C) (12/23 0619) Pulse Rate:  [67-77] 70  (12/23 0619) Resp:  [18-20] 19  (12/23 0619) BP: (98-146)/(55-89) 146/89 mmHg (12/23 0619) SpO2:  [99 %] 99 % (12/23 0619) Weight:  [182 lb 14.4 oz (82.963 kg)] 182 lb 14.4 oz (82.963 kg) (12/23 0619) Last BM Date: 08/30/12  Intake/Output from previous day: 12/22 0701 - 12/23 0700 In: 243 [P.O.:240; I.V.:3] Out: 400 [Urine:400] Intake/Output this shift:    General appearance: alert, cooperative and mild distress Resp: clear to auscultation bilaterally Cardio: regular rate and rhythm, S1, S2 normal, no murmur, click, rub or gallop GI: Tender llq with guarding, no significant rebound and rest of abdomen is soft and not tender.  Lab Results:  No results found for this or any previous visit (from the past 24 hour(s)).   Studies/Results Radiology     MEDS, Scheduled    . ciprofloxacin  400 mg Intravenous Q12H  . metronidazole  500 mg Intravenous Q8H  . sodium chloride  3 mL Intravenous Q12H       LOS: 6 days    Currie Paris, MD, [facility], [place] [phone]   09/01/2012 8:44 AM

## 2012-09-01 NOTE — ED Notes (Signed)
Patient denies pain and is resting comfortably.  

## 2012-09-01 NOTE — Progress Notes (Signed)
48 Spoken with dr. Denny Levy re: pt's T=101.7 and Dr. Hessie Knows plan to have repeat blood culture to be done . Made him aware that pt's sister wants update and tell her the plan of care

## 2012-09-02 LAB — BASIC METABOLIC PANEL
CO2: 24 mEq/L (ref 19–32)
Chloride: 104 mEq/L (ref 96–112)
Creatinine, Ser: 0.68 mg/dL (ref 0.50–1.10)
Potassium: 3.5 mEq/L (ref 3.5–5.1)
Sodium: 137 mEq/L (ref 135–145)

## 2012-09-02 LAB — CBC
HCT: 29.6 % — ABNORMAL LOW (ref 36.0–46.0)
Hemoglobin: 10.7 g/dL — ABNORMAL LOW (ref 12.0–15.0)
RBC: 3.49 MIL/uL — ABNORMAL LOW (ref 3.87–5.11)

## 2012-09-02 MED ORDER — CIPROFLOXACIN HCL 500 MG PO TABS
500.0000 mg | ORAL_TABLET | Freq: Two times a day (BID) | ORAL | Status: DC
Start: 2012-09-02 — End: 2012-09-02
  Filled 2012-09-02 (×2): qty 1

## 2012-09-02 MED ORDER — POTASSIUM CHLORIDE CRYS ER 20 MEQ PO TBCR
40.0000 meq | EXTENDED_RELEASE_TABLET | Freq: Once | ORAL | Status: AC
  Administered 2012-09-02: 40 meq via ORAL
  Filled 2012-09-02: qty 2

## 2012-09-02 MED ORDER — METRONIDAZOLE 500 MG PO TABS: 500.0000 mg | ORAL_TABLET | Freq: Two times a day (BID) | ORAL | Status: DC

## 2012-09-02 MED ORDER — CIPROFLOXACIN IN D5W 400 MG/200ML IV SOLN
400.0000 mg | Freq: Two times a day (BID) | INTRAVENOUS | Status: DC
  Filled 2012-09-02: qty 200

## 2012-09-02 MED ORDER — METRONIDAZOLE 500 MG PO TABS
500.0000 mg | ORAL_TABLET | Freq: Three times a day (TID) | ORAL | Status: DC
  Filled 2012-09-02 (×3): qty 1

## 2012-09-02 MED ORDER — METRONIDAZOLE IN NACL 5-0.79 MG/ML-% IV SOLN
500.0000 mg | Freq: Three times a day (TID) | INTRAVENOUS | Status: DC
  Filled 2012-09-02 (×2): qty 100

## 2012-09-02 MED ORDER — DEXTROSE 5 % IV SOLN
2.0000 g | Freq: Two times a day (BID) | INTRAVENOUS | Status: DC
  Administered 2012-09-02 – 2012-09-03 (×3): 2 g via INTRAVENOUS
  Filled 2012-09-02 (×5): qty 2

## 2012-09-02 MED ORDER — METRONIDAZOLE 500 MG PO TABS
500.0000 mg | ORAL_TABLET | Freq: Three times a day (TID) | ORAL | Status: DC
  Administered 2012-09-02 – 2012-09-03 (×4): 500 mg via ORAL
  Filled 2012-09-02 (×6): qty 1

## 2012-09-02 MED ORDER — CIPROFLOXACIN HCL 500 MG PO TABS
500.0000 mg | ORAL_TABLET | Freq: Two times a day (BID) | ORAL | Status: DC
  Filled 2012-09-02 (×2): qty 1

## 2012-09-02 NOTE — Progress Notes (Signed)
New drainage bag connected and is to gravity. Spoke with Ralene Muskrat, PA, orders given to continue to flush drain per order. Will continue to monitor patient to end of shift.

## 2012-09-02 NOTE — Progress Notes (Signed)
Subjective: Rt TG diverticular abscess drain placed 12/23 Pt better today Output blood tinged Spiked temp last pm (102); now afeb Up in room ambulating well  Objective: Vital signs in last 24 hours: Temp:  [97.4 F (36.3 C)-102.6 F (39.2 C)] 97.4 F (36.3 C) (12/24 0452) Pulse Rate:  [54-82] 54  (12/24 0452) Resp:  [12-22] 19  (12/24 0452) BP: (96-129)/(58-80) 109/80 mmHg (12/24 0452) SpO2:  [97 %-100 %] 100 % (12/24 0452) Weight:  [180 lb 9.6 oz (81.92 kg)] 180 lb 9.6 oz (81.92 kg) (12/24 0452) Last BM Date: 09/01/12  Intake/Output from previous day: 12/23 0701 - 12/24 0700 In: 2083.8 [P.O.:290; I.V.:1793.8] Out: 37.5 [Drains:37.5] Intake/Output this shift:    PE:  afeb now Spiked 102.6 last pm VSS Drain intact Site clean and dry; nt Output 40 cc yesterday: bloody Change JP to gravity bad today per Rad Wbc wnl  Lab Results:   [facility] 09/02/12 0505 08/31/12 0615  WBC 6.7 8.5  HGB 10.7* 10.7*  HCT 29.6* 30.8*  PLT 390 389   BMET  Basename 09/02/12 0505  NA 137  K 3.5  CL 104  CO2 24  GLUCOSE 95  BUN <3*  CREATININE 0.68  CALCIUM 8.9   PT/INR No results found for this basename: LABPROT:2,INR:2 in the last 72 hours ABG No results found for this basename: PHART:2,PCO2:2,PO2:2,HCO3:2 in the last 72 hours  Studies/Results: Ct Guided Abscess Drain  09/01/2012  *RADIOLOGY REPORT*  Clinical Data: Enlarging diverticular pelvic abscess  CT FLUOROSCOPY GUIDED DIVERTICULAR PELVIC ABSCESS DRAIN INSERTION  Date:  09/01/2012 11:15:00  Radiologist:  Judie Petit. Ruel Favors, M.D.  Medications:  100 mcg Fentanyl, 3 mg Versed  Guidance:  CT fluoroscopy  Fluoroscopy time:  10 seconds  Sedation time:  23 minutes  Contrast volume:  100 ml Omnipaque-300  Complications:  No immediate  PROCEDURE/FINDINGS:  Informed consent was obtained from the patient following explanation of the procedure, risks, benefits and alternatives. The patient understands, agrees and consents for the  procedure. All questions were addressed.  A time out was performed.  Maximal barrier sterile technique utilized including caps, mask, sterile gowns, sterile gloves, large sterile drape, hand hygiene, and betadine  Previous imaging reviewed.  The patient was positioned prone. Initially noncontrast localization CT performed.  There is difficulty in delineating the pelvic abscess in relation to the adjacent colon.  100 ml contrast administered.  Repeat pelvis CT performed.  Small central diverticular abscess is better visualized.  Right transgluteal area was marked externally.  Under sterile conditions and local anesthesia, a 17 gauge 16.8 cm access needle was advanced from a right trans gluteal approach under CT fluoroscopy into the collection.  Needle position confirmed with CT fluoroscopy.  Syringe aspiration yielded 20 ml exudative fluid. Sample sent Gram stain culture.  Guide wire advanced.  Guide wire position was reconfirmed with CT fluoroscopy.  Tract dilatation performed to insert a 10-French drain with the retention loop formed in the abscess cavity.  This was reconfirmed with CT imaging.  Syringe aspiration yielded 25 ml bloody exudative fluid. Abscess cavity was completely decompressed.  No significant residual collection.  Catheter secured with a Prolene suture and connected to external suction bulb.  Sterile dressing applied to the site.  No immediate complication.  The patient tolerated the procedure well.  IMPRESSION:  Successful CT fluoroscopy guided diverticular pelvic abscess drain insertion.   Original Report Authenticated By: Judie Petit. Miles Costain, M.D.     Anti-infectives:   Assessment/Plan: s/p Rt TG divertic drain placed  12/23 Better today Change JP to gravity bag today Plan per [facility]   LOS: 7 days    Heather [name] A 09/02/2012

## 2012-09-02 NOTE — Progress Notes (Signed)
Subjective: Improved this morning,  Abdomen is non tender, not distended, + BS. Had perc drain yesterday for 25ml of bloody pus per radiology.  Objective: Vital signs in last 24 hours: Temp:  [97.4 F (36.3 C)-102.6 F (39.2 C)] 97.4 F (36.3 C) (12/24 0452) Pulse Rate:  [54-82] 54  (12/24 0452) Resp:  [12-22] 19  (12/24 0452) BP: (96-129)/(58-80) 109/80 mmHg (12/24 0452) SpO2:  [97 %-100 %] 100 % (12/24 0452) Weight:  [180 lb 9.6 oz (81.92 kg)] 180 lb 9.6 oz (81.92 kg) (12/24 0452) Last BM Date: 09/01/12  Intake/Output from previous day: 12/23 0701 - 12/24 0700 In: 2083.8 [P.O.:290; I.V.:1793.8] Out: 37.5 [Drains:37.5] Intake/Output this shift:    General appearance: alert, cooperative, appears stated age and no distress Abdomen: soft, non distended or tender. VSS, afebrile  Lab Results:   Basename 09/02/12 0505 08/31/12 0615  WBC 6.7 8.5  HGB 10.7* 10.7*  HCT 29.6* 30.8*  PLT 390 389   BMET  Basename 09/02/12 0505  NA 137  K 3.5  CL 104  CO2 24  GLUCOSE 95  BUN <3*  CREATININE 0.68  CALCIUM 8.9   PT/INR No results found for this basename: LABPROT:2,INR:2 in the last 72 hours ABG No results found for this basename: PHART:2,PCO2:2,PO2:2,HCO3:2 in the last 72 hours  Studies/Results: Ct Guided Abscess Drain  09/01/2012  *RADIOLOGY REPORT*  Clinical Data: Enlarging diverticular pelvic abscess  CT FLUOROSCOPY GUIDED DIVERTICULAR PELVIC ABSCESS DRAIN INSERTION  Date:  09/01/2012 11:15:00  Radiologist:  Judie Petit. Ruel Favors, M.D.  Medications:  100 mcg Fentanyl, 3 mg Versed  Guidance:  CT fluoroscopy  Fluoroscopy time:  10 seconds  Sedation time:  23 minutes  Contrast volume:  100 ml Omnipaque-300  Complications:  No immediate  PROCEDURE/FINDINGS:  Informed consent was obtained from the patient following explanation of the procedure, risks, benefits and alternatives. The patient understands, agrees and consents for the procedure. All questions were addressed.  A time  out was performed.  Maximal barrier sterile technique utilized including caps, mask, sterile gowns, sterile gloves, large sterile drape, hand hygiene, and betadine  Previous imaging reviewed.  The patient was positioned prone. Initially noncontrast localization CT performed.  There is difficulty in delineating the pelvic abscess in relation to the adjacent colon.  100 ml contrast administered.  Repeat pelvis CT performed.  Small central diverticular abscess is better visualized.  Right transgluteal area was marked externally.  Under sterile conditions and local anesthesia, a 17 gauge 16.8 cm access needle was advanced from a right trans gluteal approach under CT fluoroscopy into the collection.  Needle position confirmed with CT fluoroscopy.  Syringe aspiration yielded 20 ml exudative fluid. Sample sent Gram stain culture.  Guide wire advanced.  Guide wire position was reconfirmed with CT fluoroscopy.  Tract dilatation performed to insert a 10-French drain with the retention loop formed in the abscess cavity.  This was reconfirmed with CT imaging.  Syringe aspiration yielded 25 ml bloody exudative fluid. Abscess cavity was completely decompressed.  No significant residual collection.  Catheter secured with a Prolene suture and connected to external suction bulb.  Sterile dressing applied to the site.  No immediate complication.  The patient tolerated the procedure well.  IMPRESSION:  Successful CT fluoroscopy guided diverticular pelvic abscess drain insertion.   Original Report Authenticated By: Judie Petit. Miles Costain, M.D.     Anti-infectives: Anti-infectives     Start     Dose/Rate Route Frequency Ordered Stop   08/27/12 0700   ciprofloxacin (CIPRO) IVPB  400 mg        400 mg 200 mL/hr over 60 Minutes Intravenous Every 12 hours 08/27/12 0651     08/27/12 0700   metroNIDAZOLE (FLAGYL) IVPB 500 mg        500 mg 100 mL/hr over 60 Minutes Intravenous Every 8 hours 08/27/12 0651     08/27/12 0300   ciprofloxacin (CIPRO)  IVPB 400 mg        400 mg 200 mL/hr over 60 Minutes Intravenous  Once 08/27/12 0256 08/27/12 0423   08/27/12 0300   metroNIDAZOLE (FLAGYL) IVPB 500 mg        500 mg 100 mL/hr over 60 Minutes Intravenous  Once 08/27/12 0256 08/27/12 1610          Assessment/Plan:  Patient Active Problem List  Diagnosis  . Abdominal pain  . Perforated bowel  . Acute diverticulitis  . Colonic diverticular abscess  s/p perc drain 09/01/12 for 25ml of "bloody pus" by IR  Plan: 1. PO ABX 2. Start clears today 3. Follow clinical picture, recheck labs in am 4. ? home soon  s/p * No surgery found *   LOS: 7 days    Heather [name] 09/02/2012

## 2012-09-02 NOTE — Progress Notes (Signed)
Agree with A&P of JD,NP. She is clearly improved from yesterday's exam, now s/p perc drain of the abscess

## 2012-09-02 NOTE — Progress Notes (Signed)
CRITICAL VALUE ALERT  Critical value received:  Gram Negative Rod/ 1st Aerobic Set  Date of notification:  09/02/2012  Time of notification:  1245  Critical value read back:yes  Nurse who received alert:  Milta Deiters  MD notified (1st page):  Dr. Janee Morn /Text page  Time of first page: 1300  MD notified (2nd page): n/a  Time of second page: n/a  Responding MD: Dr. Janee Morn  Time MD responded:  n/a

## 2012-09-02 NOTE — Progress Notes (Signed)
[facility] PROGRESS NOTE  Dean Wonder WUJ:[id number] DOB: 02-28-1971 DOA: 08/26/2012 PCP: Default, Provider, MD  Assessment/Plan: Principal Problem:  *Acute diverticulitis Active Problems:  Abdominal pain  Perforated bowel  Colonic diverticular abscess    1.  Acute diverticulitis complicated with abscess/questionable perforation/colonic diverticular abscess: Patient presented abdominal pain, a leukocytosis, and radiologically confirmed acute sigmoid diverticulitis, complicated by microperforation. Managing with iv Ciprofloxacin/Flagyl. Surgical consultation was initally provided by Dr Axel Filler, and patient ws subsequently seen by Dr Glenna Fellows, and Dr Ray Church. Blood cultures have remained negative so far, patient continues to spike occasional, pyrexia, although wcc has trended down. Repeat abdominal/pelvic CT of 08/31/12, showed interval increase in abscess, and patient is s/p CT-Guided pelvic drain, by Dr Jetty Peeks today. General surgery's opinion is that patient may ultimately need hemicolectomy with diverting colostomy. Repeat blood cultures 09/01/13 with 1/2 with GNR. Abscess cultures pending. Will change IV cipro to IV cefepime while awaiting final culture results.   Code Status: Full Code.  Family Communication: Discussed with patient  Disposition Plan: Per surgical team.    Brief narrative: 41 yo healthy female comes in with several days of generalized abd pain mainly in lower quadrants with loose nonbloody stools. No n/v. No fevers. Overall healthy. Ct shows diverticulitis with absess/small perf. No recent abx.Admitted for further management.    Consultants:  Dr Axel Filler, surgeon.   Dr Jetty Peeks, interventional radiologist.   Procedures:  Abdominal X-Rays.  Serial CT abdomen/pelvis.   Antibiotics:  Ciprofloxacin 08/26/12>>>09/02/12  Flagyl 08/26/12>>>  Cefepime 09/02/12  HPI/Subjective: Patient states abdominal pain  improving. Patient currently eating bland diet.  Objective: Vital signs in last 24 hours: Temp:  [97.4 F (36.3 C)-102.6 F (39.2 C)] 97.5 F (36.4 C) (12/24 1247) Pulse Rate:  [54-75] 75  (12/24 1247) Resp:  [16-19] 18  (12/24 1247) BP: (96-129)/(58-80) 116/73 mmHg (12/24 1247) SpO2:  [99 %-100 %] 99 % (12/24 1247) Weight:  [81.92 kg (180 lb 9.6 oz)] 81.92 kg (180 lb 9.6 oz) (12/24 0452) Weight change: -1.043 kg (-2 lb 4.8 oz) Last BM Date: 09/01/12  Intake/Output from previous day: 12/23 0701 - 12/24 0700 In: 2083.8 [P.O.:290; I.V.:1793.8] Out: 37.5 [Drains:37.5]     Physical Exam: General: Not in acute distress, alert, communicative, fully oriented, not short of breath at rest.  HEENT:  Mild clinical pallor, no jaundice, no conjunctival injection or discharge. NECK:  Supple, JVP not seen, no carotid bruits, no palpable lymphadenopathy, no palpable goiter. CHEST:  Clinically clear to auscultation, no wheezes, no crackles. HEART:  Sounds 1 and 2 heard, normal, regular, no murmurs. ABDOMEN:  Full, soft, tender to palpation in LLQ and suprapubic region, bowel sounds heard. Perc drainage tube is in situ, under clean dry dressings. . GENITALIA:  Not examined. LOWER EXTREMITIES:  No pitting edema, palpable peripheral pulses. MUSCULOSKELETAL SYSTEM:  Unremarkable.  CENTRAL NERVOUS SYSTEM:  No focal neurologic deficit on gross examination.  Lab Results:  [facility] 09/02/12 0505 08/31/12 0615  WBC 6.7 8.5  HGB 10.7* 10.7*  HCT 29.6* 30.8*  PLT 390 389    Basename 09/02/12 0505  NA 137  K 3.5  CL 104  CO2 24  GLUCOSE 95  BUN <3*  CREATININE 0.68  CALCIUM 8.9   Recent Results (from the past 240 hour(s))  CULTURE, BLOOD (ROUTINE X 2)     Status: Normal (Preliminary result)   Collection Time   08/27/12  8:10 PM      Component Value Range Status Comment  Specimen Description BLOOD RIGHT ARM   Final    Special Requests BOTTLES DRAWN AEROBIC AND ANAEROBIC 10CC   Final     Culture  Setup Time 08/28/2012 04:01   Final    Culture     Final    Value:        BLOOD CULTURE RECEIVED NO GROWTH TO DATE CULTURE WILL BE HELD FOR 5 DAYS BEFORE ISSUING A FINAL NEGATIVE REPORT   Report Status PENDING   Incomplete   CULTURE, BLOOD (ROUTINE X 2)     Status: Normal (Preliminary result)   Collection Time   08/27/12  8:11 PM      Component Value Range Status Comment   Specimen Description BLOOD RIGHT HAND   Final    Special Requests BOTTLES DRAWN AEROBIC AND ANAEROBIC 10CC   Final    Culture  Setup Time 08/28/2012 04:01   Final    Culture     Final    Value:        BLOOD CULTURE RECEIVED NO GROWTH TO DATE CULTURE WILL BE HELD FOR 5 DAYS BEFORE ISSUING A FINAL NEGATIVE REPORT   Report Status PENDING   Incomplete   CULTURE, ROUTINE-ABSCESS     Status: Normal (Preliminary result)   Collection Time   09/01/12  1:24 PM      Component Value Range Status Comment   Specimen Description ABSCESS BUTTOCKS RIGHT   Final    Special Requests NONE   Final    Gram Stain     Final    Value: ABUNDANT WBC PRESENT,BOTH PMN AND MONONUCLEAR     NO SQUAMOUS EPITHELIAL CELLS SEEN     MODERATE GRAM POSITIVE COCCI     IN PAIRS MODERATE GRAM NEGATIVE RODS     FEW GRAM POSITIVE RODS   Culture Culture reincubated for better growth   Final    Report Status PENDING   Incomplete   ANAEROBIC CULTURE     Status: Normal (Preliminary result)   Collection Time   09/01/12  1:24 PM      Component Value Range Status Comment   Specimen Description ABSCESS BUTTOCKS RIGHT   Final    Special Requests NONE   Final    Gram Stain     Final    Value: ABUNDANT WBC PRESENT,BOTH PMN AND MONONUCLEAR     NO SQUAMOUS EPITHELIAL CELLS SEEN     MODERATE GRAM POSITIVE COCCI     IN PAIRS MODERATE GRAM NEGATIVE RODS     FEW GRAM POSITIVE RODS   Culture     Final    Value: NO ANAEROBES ISOLATED; CULTURE IN PROGRESS FOR 5 DAYS   Report Status PENDING   Incomplete   CULTURE, BLOOD (ROUTINE X 2)     Status: Normal  (Preliminary result)   Collection Time   09/01/12  5:43 PM      Component Value Range Status Comment   Specimen Description BLOOD RIGHT ARM   Final    Special Requests BOTTLES DRAWN AEROBIC ONLY 10CC   Final    Culture  Setup Time 09/02/2012 01:41   Final    Culture     Final    Value: GRAM NEGATIVE RODS     Note: Gram Stain Report Called to,Read Back By and Verified With: RASHIDA H@1243  ON [id number] BY [facility]   Report Status PENDING   Incomplete      Studies/Results: Ct Guided Abscess Drain  09/01/2012  *RADIOLOGY REPORT*  Clinical Data: Enlarging diverticular pelvic  abscess  CT FLUOROSCOPY GUIDED DIVERTICULAR PELVIC ABSCESS DRAIN INSERTION  Date:  09/01/2012 11:15:00  Radiologist:  M. Ruel Favors, M.D.  Medications:  100 mcg Fentanyl, 3 mg Versed  Guidance:  CT fluoroscopy  Fluoroscopy time:  10 seconds  Sedation time:  23 minutes  Contrast volume:  100 ml Omnipaque-300  Complications:  No immediate  PROCEDURE/FINDINGS:  Informed consent was obtained from the patient following explanation of the procedure, risks, benefits and alternatives. The patient understands, agrees and consents for the procedure. All questions were addressed.  A time out was performed.  Maximal barrier sterile technique utilized including caps, mask, sterile gowns, sterile gloves, large sterile drape, hand hygiene, and betadine  Previous imaging reviewed.  The patient was positioned prone. Initially noncontrast localization CT performed.  There is difficulty in delineating the pelvic abscess in relation to the adjacent colon.  100 ml contrast administered.  Repeat pelvis CT performed.  Small central diverticular abscess is better visualized.  Right transgluteal area was marked externally.  Under sterile conditions and local anesthesia, a 17 gauge 16.8 cm access needle was advanced from a right trans gluteal approach under CT fluoroscopy into the collection.  Needle position confirmed with CT fluoroscopy.  Syringe aspiration  yielded 20 ml exudative fluid. Sample sent Gram stain culture.  Guide wire advanced.  Guide wire position was reconfirmed with CT fluoroscopy.  Tract dilatation performed to insert a 10-French drain with the retention loop formed in the abscess cavity.  This was reconfirmed with CT imaging.  Syringe aspiration yielded 25 ml bloody exudative fluid. Abscess cavity was completely decompressed.  No significant residual collection.  Catheter secured with a Prolene suture and connected to external suction bulb.  Sterile dressing applied to the site.  No immediate complication.  The patient tolerated the procedure well.  IMPRESSION:  Successful CT fluoroscopy guided diverticular pelvic abscess drain insertion.   Original Report Authenticated By: Judie Petit. Miles Costain, M.D.     Medications: Scheduled Meds:    . ceFEPime (MAXIPIME) IV  2 g Intravenous Q12H  . metroNIDAZOLE  500 mg Oral Q8H  . sodium chloride  3 mL Intravenous Q12H   Continuous Infusions:    . 0.9 % NaCl with KCl 40 mEq / L 125 mL/hr at 09/02/12 0520   PRN Meds:.acetaminophen, HYDROmorphone (DILAUDID) injection, ibuprofen, ondansetron (ZOFRAN) IV, ondansetron, oxyCODONE    LOS: 7 days   [facility]  [facility] Pager [phone]. If 8PM-8AM, please contact night-coverage at [email or web address], password [facility] 09/02/2012, 1:26 PM  LOS: 7 days

## 2012-09-03 ENCOUNTER — Inpatient Hospital Stay (HOSPITAL_COMMUNITY): Payer: Medicaid Other

## 2012-09-03 DIAGNOSIS — R7881 Bacteremia: Secondary | ICD-10-CM | POA: Diagnosis present

## 2012-09-03 DIAGNOSIS — K651 Peritoneal abscess: Secondary | ICD-10-CM

## 2012-09-03 DIAGNOSIS — D649 Anemia, unspecified: Secondary | ICD-10-CM

## 2012-09-03 LAB — BASIC METABOLIC PANEL
CO2: 26 mEq/L (ref 19–32)
Calcium: 9.1 mg/dL (ref 8.4–10.5)
Chloride: 105 mEq/L (ref 96–112)
Creatinine, Ser: 0.65 mg/dL (ref 0.50–1.10)
Glucose, Bld: 91 mg/dL (ref 70–99)

## 2012-09-03 LAB — CULTURE, BLOOD (ROUTINE X 2)

## 2012-09-03 LAB — CBC WITH DIFFERENTIAL/PLATELET
Eosinophils Relative: 5 % (ref 0–5)
HCT: 29.2 % — ABNORMAL LOW (ref 36.0–46.0)
Hemoglobin: 10.3 g/dL — ABNORMAL LOW (ref 12.0–15.0)
Lymphocytes Relative: 27 % (ref 12–46)
Lymphs Abs: 1.7 10*3/uL (ref 0.7–4.0)
MCV: 85.4 fL (ref 78.0–100.0)
Monocytes Absolute: 1.3 10*3/uL — ABNORMAL HIGH (ref 0.1–1.0)
Neutro Abs: 2.9 10*3/uL (ref 1.7–7.7)
RBC: 3.42 MIL/uL — ABNORMAL LOW (ref 3.87–5.11)
WBC: 6.1 10*3/uL (ref 4.0–10.5)

## 2012-09-03 MED ORDER — CLOTRIMAZOLE 2 % VA CREA
1.0000 | TOPICAL_CREAM | Freq: Every day | VAGINAL | Status: DC
  Filled 2012-09-03: qty 22.2

## 2012-09-03 MED ORDER — PIPERACILLIN-TAZOBACTAM 3.375 G IVPB
3.3750 g | Freq: Three times a day (TID) | INTRAVENOUS | Status: DC
  Administered 2012-09-03 – 2012-09-04 (×4): 3.375 g via INTRAVENOUS
  Filled 2012-09-03 (×6): qty 50

## 2012-09-03 MED ORDER — FLUCONAZOLE 150 MG PO TABS
150.0000 mg | ORAL_TABLET | Freq: Once | ORAL | Status: AC
  Administered 2012-09-03: 150 mg via ORAL
  Filled 2012-09-03: qty 1

## 2012-09-03 MED ORDER — CLOTRIMAZOLE 1 % VA CREA
1.0000 | TOPICAL_CREAM | Freq: Every day | VAGINAL | Status: DC
  Filled 2012-09-03: qty 45

## 2012-09-03 NOTE — Progress Notes (Signed)
Abdomen is moderately tender llq and she complains of pain, she is passing flatus but no bm yet, will check kub rule out ileus, if pain continues may need another ct?  Although wbc is normal, afebrile

## 2012-09-03 NOTE — Progress Notes (Signed)
[facility] PROGRESS NOTE  Heather [name] ZOX:[id number] DOB: 07-09-1971 DOA: 08/26/2012 PCP: Default, Provider, MD  Assessment/Plan: Principal Problem:  *Acute diverticulitis Active Problems:  Abdominal pain  Perforated bowel  Colonic diverticular abscess  Anemia  Bacteremia    1.  Acute diverticulitis complicated with abscess/questionable perforation/colonic diverticular abscess: Patient presented abdominal pain, a leukocytosis, and radiologically confirmed acute sigmoid diverticulitis, complicated by microperforation. Managing with iv Ciprofloxacin/Flagyl. Surgical consultation was initally provided by Dr Axel Filler, and patient ws subsequently seen by Dr Glenna Fellows, and Dr Ray Church. Blood cultures have remained negative so far, patient continues to spike occasional fevers last one 100.1 last evening, pyrexia, although wcc has trended down. Repeat abdominal/pelvic CT of 08/31/12, showed interval increase in abscess, and patient is s/p CT-Guided pelvic drain, by Dr Jetty Peeks . General surgery's opinion is that patient may ultimately need hemicolectomy with diverting colostomy. Repeat blood cultures 09/01/13 with  E. coli, sensitivities pending. Abscess cultures pending. Changed  IV cipro to IV cefepime yesterday while awaiting final culture results. Will change IV cefepime and oral Flagyl to IV Zosyn. Will consult with ID.  2. E coli bacteremia Likely seeded from acute diverticulitis with abscess. Patient still with low-grade fevers last one 100.1 around 6:37 PM last night. White count is within normal limits. Sensitivities are pending. Will change antibiotics from IV cefepime and oral Flagyl to IV Zosyn. We'll consult infectious diseases for further evaluation and management.  3. anemia Likely secondary to iron deficiency anemia in a menstruating female. Patient with no overt GI bleed. Hemoglobin has been stable around 10. Will check an anemia panel. Follow  H&H.  4.prophylaxis SCDs for DVT prophylaxis.     Code Status: Full Code.  Family Communication: Discussed with patient  Disposition Plan: Per surgical team.    Brief narrative: 41 yo healthy female comes in with several days of generalized abd pain mainly in lower quadrants with loose nonbloody stools. No n/v. No fevers. Overall healthy. Ct shows diverticulitis with absess/small perf. No recent abx.Admitted for further management.    Consultants:  Dr Axel Filler, surgeon.   Dr Jetty Peeks, interventional radiologist.   Infectious disease: Dr. Ninetta Lights pending  Procedures:  Abdominal X-Rays.  Serial CT abdomen/pelvis.   CT guided pelvis drain  Antibiotics:  Ciprofloxacin 08/26/12>>>09/02/12  Flagyl 08/26/12>>> 09/03/2012  Cefepime 09/02/12 >>>> 09/03/2012  IV Zosyn 09/03/2012  HPI/Subjective: Patient states abdominal pain improving. Patient currently eating bland diet. Patient complaining of right buttocks pain around the drain site.  Objective: Vital signs in last 24 hours: Temp:  [98.6 F (37 C)-100.1 F (37.8 C)] 98.7 F (37.1 C) (12/25 1419) Pulse Rate:  [70-74] 74  (12/25 1419) Resp:  [18] 18  (12/25 1419) BP: (101-120)/(75-82) 101/75 mmHg (12/25 1419) SpO2:  [96 %-99 %] 96 % (12/25 1419) Weight:  [82.3 kg (181 lb 7 oz)] 82.3 kg (181 lb 7 oz) (12/25 0529) Weight change: 0.38 kg (13.4 oz) Last BM Date: 09/02/12  Intake/Output from previous day: 12/24 0701 - 12/25 0700 In: 1841.7 [P.O.:720; I.V.:1066.7; IV Piggyback:50] Out: 5 [Drains:5] Total I/O In: 220 [P.O.:220] Out: -    Physical Exam: General: Not in acute distress, alert, communicative, fully oriented, not short of breath at rest.  HEENT:  Mild clinical pallor, no jaundice, no conjunctival injection or discharge. NECK:  Supple, JVP not seen, no carotid bruits, no palpable lymphadenopathy, no palpable goiter. CHEST:  Clinically clear to auscultation, no wheezes, no  crackles. HEART:  Sounds 1 and 2 heard, normal,  regular, no murmurs. ABDOMEN:  Full, soft, tender to palpation in LLQ and suprapubic region, bowel sounds heard. Perc drainage tube is in situ, under clean dry dressings. Right buttocks with slight erythema around the insertion of drain site. GENITALIA:  Not examined. LOWER EXTREMITIES:  No pitting edema, palpable peripheral pulses. MUSCULOSKELETAL SYSTEM:  Unremarkable.  CENTRAL NERVOUS SYSTEM:  No focal neurologic deficit on gross examination.  Lab Results:  [facility] 09/03/12 0515 09/02/12 0505  WBC 6.1 6.7  HGB 10.3* 10.7*  HCT 29.2* 29.6*  PLT 374 390    Basename 09/03/12 0515 09/02/12 0505  NA 139 137  K 4.6 3.5  CL 105 104  CO2 26 24  GLUCOSE 91 95  BUN 4* <3*  CREATININE 0.65 0.68  CALCIUM 9.1 8.9   Recent Results (from the past 240 hour(s))  CULTURE, BLOOD (ROUTINE X 2)     Status: Normal   Collection Time   08/27/12  8:10 PM      Component Value Range Status Comment   Specimen Description BLOOD RIGHT ARM   Final    Special Requests BOTTLES DRAWN AEROBIC AND ANAEROBIC 10CC   Final    Culture  Setup Time 08/28/2012 04:01   Final    Culture NO GROWTH 5 DAYS   Final    Report Status 09/03/2012 FINAL   Final   CULTURE, BLOOD (ROUTINE X 2)     Status: Normal   Collection Time   08/27/12  8:11 PM      Component Value Range Status Comment   Specimen Description BLOOD RIGHT HAND   Final    Special Requests BOTTLES DRAWN AEROBIC AND ANAEROBIC 10CC   Final    Culture  Setup Time 08/28/2012 04:01   Final    Culture NO GROWTH 5 DAYS   Final    Report Status 09/03/2012 FINAL   Final   CULTURE, ROUTINE-ABSCESS     Status: Normal (Preliminary result)   Collection Time   09/01/12  1:24 PM      Component Value Range Status Comment   Specimen Description ABSCESS BUTTOCKS RIGHT   Final    Special Requests NONE   Final    Gram Stain     Final    Value: ABUNDANT WBC PRESENT,BOTH PMN AND MONONUCLEAR     NO SQUAMOUS EPITHELIAL  CELLS SEEN     MODERATE GRAM POSITIVE COCCI     IN PAIRS MODERATE GRAM NEGATIVE RODS     FEW GRAM POSITIVE RODS   Culture MULTIPLE ORGANISMS PRESENT, NONE PREDOMINANT   Final    Report Status PENDING   Incomplete   ANAEROBIC CULTURE     Status: Normal (Preliminary result)   Collection Time   09/01/12  1:24 PM      Component Value Range Status Comment   Specimen Description ABSCESS BUTTOCKS RIGHT   Final    Special Requests NONE   Final    Gram Stain     Final    Value: ABUNDANT WBC PRESENT,BOTH PMN AND MONONUCLEAR     NO SQUAMOUS EPITHELIAL CELLS SEEN     MODERATE GRAM POSITIVE COCCI     IN PAIRS MODERATE GRAM NEGATIVE RODS     FEW GRAM POSITIVE RODS   Culture     Final    Value: NO ANAEROBES ISOLATED; CULTURE IN PROGRESS FOR 5 DAYS   Report Status PENDING   Incomplete   CULTURE, BLOOD (ROUTINE X 2)     Status: Normal (Preliminary result)  Collection Time   09/01/12  5:43 PM      Component Value Range Status Comment   Specimen Description BLOOD RIGHT ARM   Final    Special Requests BOTTLES DRAWN AEROBIC ONLY 10CC   Final    Culture  Setup Time 09/02/2012 01:41   Final    Culture     Final    Value: ESCHERICHIA COLI     Note: Gram Stain Report Called to,Read Back By and Verified With: RASHIDA H@1243  ON [id number] BY [facility]   Report Status PENDING   Incomplete   CULTURE, BLOOD (ROUTINE X 2)     Status: Normal (Preliminary result)   Collection Time   09/01/12  5:43 PM      Component Value Range Status Comment   Specimen Description BLOOD RIGHT ARM   Final    Special Requests BOTTLES DRAWN AEROBIC ONLY 10CC   Final    Culture  Setup Time 09/02/2012 01:41   Final    Culture     Final    Value:        BLOOD CULTURE RECEIVED NO GROWTH TO DATE CULTURE WILL BE HELD FOR 5 DAYS BEFORE ISSUING A FINAL NEGATIVE REPORT   Report Status PENDING   Incomplete      Studies/Results: Dg Abd 1 View  09/03/2012  *RADIOLOGY REPORT*  Clinical Data: Abdominal pain  ABDOMEN - 1 VIEW  Comparison:  08/26/2012  Findings: Pelvic presumed pigtail drainage catheter in place.  No bowel distention. Presence or absence of air fluid levels or free air cannot be assessed on this single supine view.  No acute osseous finding. No abnormal calcific opacity.  IMPRESSION: Nonobstructive bowel gas pattern.   Original Report Authenticated By: Christiana Pellant, M.D.     Medications: Scheduled Meds:    . ceFEPime (MAXIPIME) IV  2 g Intravenous Q12H  . metroNIDAZOLE  500 mg Oral Q8H  . sodium chloride  3 mL Intravenous Q12H   Continuous Infusions:   PRN Meds:.acetaminophen, HYDROmorphone (DILAUDID) injection, ibuprofen, ondansetron (ZOFRAN) IV, ondansetron, oxyCODONE    LOS: 8 days   [facility]  [facility] Pager [phone]. If 8PM-8AM, please contact night-coverage at [email or web address], password [facility] 09/03/2012, 2:46 PM  LOS: 8 days

## 2012-09-03 NOTE — Progress Notes (Signed)
Patient ID: Heather [name], female   DOB: Jul 08, 1971, 41 y.o.   MRN: [id number]    Subjective: Improved this morning,  Abdomen is non tender, not distended, + BS. Tolerating Bland diet w/o issues. Drain remains in place (5ml) drainage recorded Objective: Vital signs in last 24 hours: Temp:  [97.5 F (36.4 C)-100.1 F (37.8 C)] 98.6 F (37 C) (12/25 0529) Pulse Rate:  [70-75] 70  (12/25 0529) Resp:  [18] 18  (12/25 0529) BP: (109-120)/(73-82) 120/82 mmHg (12/25 0529) SpO2:  [98 %-99 %] 98 % (12/25 0529) Weight:  [181 lb 7 oz (82.3 kg)] 181 lb 7 oz (82.3 kg) (12/25 0529) Last BM Date: 09/02/12  Intake/Output from previous day: 12/24 0701 - 12/25 0700 In: 1841.7 [P.O.:720; I.V.:1066.7; IV Piggyback:50] Out: 5 [Drains:5] Intake/Output this shift:    General appearance: alert, cooperative, appears stated age and no distress Abdomen: soft, non distended or tender. VSS, afebrile Culture: Right arm blood culture : Gram Neg rods (abx changed to Cefepime by hospitalist) Wound aspiration culture pending.  Lab Results:   [facility] 09/03/12 0515 09/02/12 0505  WBC 6.1 6.7  HGB 10.3* 10.7*  HCT 29.2* 29.6*  PLT 374 390   BMET  Basename 09/03/12 0515 09/02/12 0505  NA 139 137  K 4.6 3.5  CL 105 104  CO2 26 24  GLUCOSE 91 95  BUN 4* <3*  CREATININE 0.65 0.68  CALCIUM 9.1 8.9   PT/INR No results found for this basename: LABPROT:2,INR:2 in the last 72 hours ABG No results found for this basename: PHART:2,PCO2:2,PO2:2,HCO3:2 in the last 72 hours  Studies/Results: Ct Guided Abscess Drain  09/01/2012  *RADIOLOGY REPORT*  Clinical Data: Enlarging diverticular pelvic abscess  CT FLUOROSCOPY GUIDED DIVERTICULAR PELVIC ABSCESS DRAIN INSERTION  Date:  09/01/2012 11:15:00  Radiologist:  Judie Petit. Ruel Favors, M.D.  Medications:  100 mcg Fentanyl, 3 mg Versed  Guidance:  CT fluoroscopy  Fluoroscopy time:  10 seconds  Sedation time:  23 minutes  Contrast volume:  100 ml Omnipaque-300   Complications:  No immediate  PROCEDURE/FINDINGS:  Informed consent was obtained from the patient following explanation of the procedure, risks, benefits and alternatives. The patient understands, agrees and consents for the procedure. All questions were addressed.  A time out was performed.  Maximal barrier sterile technique utilized including caps, mask, sterile gowns, sterile gloves, large sterile drape, hand hygiene, and betadine  Previous imaging reviewed.  The patient was positioned prone. Initially noncontrast localization CT performed.  There is difficulty in delineating the pelvic abscess in relation to the adjacent colon.  100 ml contrast administered.  Repeat pelvis CT performed.  Small central diverticular abscess is better visualized.  Right transgluteal area was marked externally.  Under sterile conditions and local anesthesia, a 17 gauge 16.8 cm access needle was advanced from a right trans gluteal approach under CT fluoroscopy into the collection.  Needle position confirmed with CT fluoroscopy.  Syringe aspiration yielded 20 ml exudative fluid. Sample sent Gram stain culture.  Guide wire advanced.  Guide wire position was reconfirmed with CT fluoroscopy.  Tract dilatation performed to insert a 10-French drain with the retention loop formed in the abscess cavity.  This was reconfirmed with CT imaging.  Syringe aspiration yielded 25 ml bloody exudative fluid. Abscess cavity was completely decompressed.  No significant residual collection.  Catheter secured with a Prolene suture and connected to external suction bulb.  Sterile dressing applied to the site.  No immediate complication.  The patient tolerated the procedure well.  IMPRESSION:  Successful CT fluoroscopy guided diverticular pelvic abscess drain insertion.   Original Report Authenticated By: Judie Petit. Miles Costain, M.D.     Anti-infectives: Anti-infectives     Start     Dose/Rate Route Frequency Ordered Stop   09/02/12 2200   ciprofloxacin (CIPRO)  IVPB 400 mg  Status:  Discontinued        400 mg 200 mL/hr over 60 Minutes Intravenous Every 12 hours 09/02/12 0908 09/02/12 0911   09/02/12 2000   ciprofloxacin (CIPRO) tablet 500 mg  Status:  Discontinued        500 mg Oral 2 times daily 09/02/12 0902 09/02/12 0907   09/02/12 2000   ciprofloxacin (CIPRO) tablet 500 mg  Status:  Discontinued        500 mg Oral 2 times daily 09/02/12 0912 09/02/12 1309   09/02/12 1400   metroNIDAZOLE (FLAGYL) tablet 500 mg  Status:  Discontinued        500 mg Oral 3 times per day 09/02/12 0904 09/02/12 0908   09/02/12 1400   metroNIDAZOLE (FLAGYL) IVPB 500 mg  Status:  Discontinued        500 mg 100 mL/hr over 60 Minutes Intravenous Every 8 hours 09/02/12 0908 09/02/12 0911   09/02/12 1400   metroNIDAZOLE (FLAGYL) tablet 500 mg        500 mg Oral 3 times per day 09/02/12 0912     09/02/12 1400   ceFEPIme (MAXIPIME) 2 g in dextrose 5 % 50 mL IVPB        2 g 100 mL/hr over 30 Minutes Intravenous Every 12 hours 09/02/12 1309     09/02/12 1000   metroNIDAZOLE (FLAGYL) tablet 500 mg  Status:  Discontinued        500 mg Oral Every 12 hours 09/02/12 0903 09/02/12 0903   08/27/12 0700   ciprofloxacin (CIPRO) IVPB 400 mg  Status:  Discontinued        400 mg 200 mL/hr over 60 Minutes Intravenous Every 12 hours 08/27/12 0651 09/02/12 0902   08/27/12 0700   metroNIDAZOLE (FLAGYL) IVPB 500 mg  Status:  Discontinued        500 mg 100 mL/hr over 60 Minutes Intravenous Every 8 hours 08/27/12 0651 09/02/12 0902   08/27/12 0300   ciprofloxacin (CIPRO) IVPB 400 mg        400 mg 200 mL/hr over 60 Minutes Intravenous  Once 08/27/12 0256 08/27/12 0423   08/27/12 0300   metroNIDAZOLE (FLAGYL) IVPB 500 mg        500 mg 100 mL/hr over 60 Minutes Intravenous  Once 08/27/12 0256 08/27/12 9604          Assessment/Plan:  Patient Active Problem List  Diagnosis  . Abdominal pain  . Perforated bowel  . Acute diverticulitis  . Colonic diverticular abscess  s/p  perc drain 09/01/12 for 25ml of "bloody pus" by IR  Plan: 1. Continue ABX; await aspiration culture results. 2. Avance diet as tolerated 3. Follow clinical picture, recheck labs in am. 4. ? Home in am Will need to go home on abx.  s/p * No surgery found *   LOS: 8 days    Golda Acre [facility] Surgery Pager # [phone] 09/03/2012

## 2012-09-03 NOTE — Consult Note (Signed)
INFECTIOUS DISEASE CONSULT NOTE  Date of Admission:  08/26/2012  Date of Consult:  09/03/2012  Reason for Consult: Abdominal Abscess Referring Physician: Thompson  Impression/Recommendation Abdominal abscess Diverticulitis  Will- cont her zosyn Recheck her BCx Check HIV and Hepatitis C as per CDC guidelines.  Appreciate surgical f/u Comment- gram negative bacteremia does not typically need work up for endocarditis. Repeating her BCx is useful for assessment of clearance. Would consider prolonged IV therapy though given her initial failure of treatment with anbx alone.      Johny Sax [phone]  Heather [name] is an 41 y.o. female.  HPI: 41 yo F with hx of ED visit on 12-18 with abd pain and loose stool. She was found on CT to have diverticulitis with a small perforation with abscess (3.5 x 3.5 cm). She was started on cipro/flagyl. She had temp 101.2 within first 24h of admission. She again had fever 101.8 and continued abd pain on 12-22. SHe has been followed by surgery and by 12-22 had repeat CT showing worsening of her abscess cavity. She was being considered for surgical intervention then underwent CT guided drain placement on 09-01-12. Her Cx was polymicrobial (awaiting final).  She had BCx drawn 12-24 which are now growing E coli.  Today states she is feeling better.   History reviewed. No pertinent past medical history.  Past Surgical History  Procedure Date  . Cesarean section   . No past surgeries      No Known Allergies  Medications:  Scheduled:   . piperacillin-tazobactam (ZOSYN)  IV  3.375 g Intravenous Q8H  . sodium chloride  3 mL Intravenous Q12H    Total days of antibiotics 8   Zosyn day 0  Social History:  reports that she has never smoked. She does not have any smokeless tobacco history on file. She reports that she drinks alcohol. Her drug history not on file.  History reviewed. No pertinent family history.  General ROS: has been eating regular  diet. has had flatus (painful), no BM. no neuropathy  Blood pressure 101/75, pulse 74, temperature 98.7 F (37.1 C), temperature source Oral, resp. rate 18, height 5\' 2"  (1.575 m), weight 82.3 kg (181 lb 7 oz), last menstrual period 08/19/2012, SpO2 96.00%. General appearance: alert, cooperative and no distress Eyes: negative Throat: normal findings: oropharynx pink & moist without lesions or evidence of thrush Neck: no adenopathy and supple, symmetrical, trachea midline Lungs: clear to auscultation bilaterally Heart: regular rate and rhythm Abdomen: normal findings: bowel sounds normal, soft, non-tender and drain in place in R flank. mild tenderness elicited by patient.  Extremities: edema none and normal light touch grossly.   Results for orders placed during the hospital encounter of 08/26/12 (from the past 48 hour(s))  CULTURE, BLOOD (ROUTINE X 2)     Status: Normal (Preliminary result)   Collection Time   09/01/12  5:43 PM      Component Value Range Comment   Specimen Description BLOOD RIGHT ARM      Special Requests BOTTLES DRAWN AEROBIC ONLY 10CC      Culture  Setup Time 09/02/2012 01:41      Culture        Value: ESCHERICHIA COLI     Note: Gram Stain Report Called to,Read Back By and Verified With: RASHIDA H@1243  ON [id number] BY [facility]   Report Status PENDING     CULTURE, BLOOD (ROUTINE X 2)     Status: Normal (Preliminary result)   Collection Time   09/01/12  5:43 PM      Component Value Range Comment   Specimen Description BLOOD RIGHT ARM      Special Requests BOTTLES DRAWN AEROBIC ONLY 10CC      Culture  Setup Time 09/02/2012 01:41      Culture        Value:        BLOOD CULTURE RECEIVED NO GROWTH TO DATE CULTURE WILL BE HELD FOR 5 DAYS BEFORE ISSUING A FINAL NEGATIVE REPORT   Report Status PENDING     CBC     Status: Abnormal   Collection Time   09/02/12  5:05 AM      Component Value Range Comment   WBC 6.7  4.0 - 10.5 K/uL    RBC 3.49 (*) 3.87 - 5.11 MIL/uL     Hemoglobin 10.7 (*) 12.0 - 15.0 g/dL    HCT 16.1 (*) 09.6 - 46.0 %    MCV 84.8  78.0 - 100.0 fL    MCH 30.7  26.0 - 34.0 pg    MCHC 36.1 (*) 30.0 - 36.0 g/dL    RDW 04.5  40.9 - 81.1 %    Platelets 390  150 - 400 K/uL   BASIC METABOLIC PANEL     Status: Abnormal   Collection Time   09/02/12  5:05 AM      Component Value Range Comment   Sodium 137  135 - 145 mEq/L    Potassium 3.5  3.5 - 5.1 mEq/L    Chloride 104  96 - 112 mEq/L    CO2 24  19 - 32 mEq/L    Glucose, Bld 95  70 - 99 mg/dL    BUN <3 (*) 6 - 23 mg/dL    Creatinine, Ser 9.14  0.50 - 1.10 mg/dL    Calcium 8.9  8.4 - 78.2 mg/dL    GFR calc non Af Amer >90  >90 mL/min    GFR calc Af Amer >90  >90 mL/min   CBC WITH DIFFERENTIAL     Status: Abnormal   Collection Time   09/03/12  5:15 AM      Component Value Range Comment   WBC 6.1  4.0 - 10.5 K/uL    RBC 3.42 (*) 3.87 - 5.11 MIL/uL    Hemoglobin 10.3 (*) 12.0 - 15.0 g/dL    HCT 95.6 (*) 21.3 - 46.0 %    MCV 85.4  78.0 - 100.0 fL    MCH 30.1  26.0 - 34.0 pg    MCHC 35.3  30.0 - 36.0 g/dL    RDW 08.6  57.8 - 46.9 %    Platelets 374  150 - 400 K/uL    Neutrophils Relative 47  43 - 77 %    Neutro Abs 2.9  1.7 - 7.7 K/uL    Lymphocytes Relative 27  12 - 46 %    Lymphs Abs 1.7  0.7 - 4.0 K/uL    Monocytes Relative 21 (*) 3 - 12 %    Monocytes Absolute 1.3 (*) 0.1 - 1.0 K/uL    Eosinophils Relative 5  0 - 5 %    Eosinophils Absolute 0.3  0.0 - 0.7 K/uL    Basophils Relative 1  0 - 1 %    Basophils Absolute 0.0  0.0 - 0.1 K/uL   BASIC METABOLIC PANEL     Status: Abnormal   Collection Time   09/03/12  5:15 AM      Component Value Range Comment  Sodium 139  135 - 145 mEq/L    Potassium 4.6  3.5 - 5.1 mEq/L    Chloride 105  96 - 112 mEq/L    CO2 26  19 - 32 mEq/L    Glucose, Bld 91  70 - 99 mg/dL    BUN 4 (*) 6 - 23 mg/dL    Creatinine, Ser 1.61  0.50 - 1.10 mg/dL    Calcium 9.1  8.4 - 09.6 mg/dL    GFR calc non Af Amer >90  >90 mL/min    GFR calc Af Amer >90  >90  mL/min       Component Value Date/Time   SDES BLOOD RIGHT ARM 09/01/2012 1743   SDES BLOOD RIGHT ARM 09/01/2012 1743   SPECREQUEST BOTTLES DRAWN AEROBIC ONLY 10CC 09/01/2012 1743   SPECREQUEST BOTTLES DRAWN AEROBIC ONLY 10CC 09/01/2012 1743   CULT  Value: ESCHERICHIA COLI Note: Gram Stain Report Called to,Read Back By and Verified With: RASHIDA H@1243  ON [id number] BY [facility] 09/01/2012 1743   CULT        BLOOD CULTURE RECEIVED NO GROWTH TO DATE CULTURE WILL BE HELD FOR 5 DAYS BEFORE ISSUING A FINAL NEGATIVE REPORT 09/01/2012 1743   REPTSTATUS PENDING 09/01/2012 1743   REPTSTATUS PENDING 09/01/2012 1743   Dg Abd 1 View  09/03/2012  *RADIOLOGY REPORT*  Clinical Data: Abdominal pain  ABDOMEN - 1 VIEW  Comparison: 08/26/2012  Findings: Pelvic presumed pigtail drainage catheter in place.  No bowel distention. Presence or absence of air fluid levels or free air cannot be assessed on this single supine view.  No acute osseous finding. No abnormal calcific opacity.  IMPRESSION: Nonobstructive bowel gas pattern.   Original Report Authenticated By: Christiana Pellant, M.D.    Recent Results (from the past 240 hour(s))  CULTURE, BLOOD (ROUTINE X 2)     Status: Normal   Collection Time   08/27/12  8:10 PM      Component Value Range Status Comment   Specimen Description BLOOD RIGHT ARM   Final    Special Requests BOTTLES DRAWN AEROBIC AND ANAEROBIC 10CC   Final    Culture  Setup Time 08/28/2012 04:01   Final    Culture NO GROWTH 5 DAYS   Final    Report Status 09/03/2012 FINAL   Final   CULTURE, BLOOD (ROUTINE X 2)     Status: Normal   Collection Time   08/27/12  8:11 PM      Component Value Range Status Comment   Specimen Description BLOOD RIGHT HAND   Final    Special Requests BOTTLES DRAWN AEROBIC AND ANAEROBIC 10CC   Final    Culture  Setup Time 08/28/2012 04:01   Final    Culture NO GROWTH 5 DAYS   Final    Report Status 09/03/2012 FINAL   Final   CULTURE, ROUTINE-ABSCESS     Status: Normal  (Preliminary result)   Collection Time   09/01/12  1:24 PM      Component Value Range Status Comment   Specimen Description ABSCESS BUTTOCKS RIGHT   Final    Special Requests NONE   Final    Gram Stain     Final    Value: ABUNDANT WBC PRESENT,BOTH PMN AND MONONUCLEAR     NO SQUAMOUS EPITHELIAL CELLS SEEN     MODERATE GRAM POSITIVE COCCI     IN PAIRS MODERATE GRAM NEGATIVE RODS     FEW GRAM POSITIVE RODS   Culture MULTIPLE ORGANISMS PRESENT, NONE  PREDOMINANT   Final    Report Status PENDING   Incomplete   ANAEROBIC CULTURE     Status: Normal (Preliminary result)   Collection Time   09/01/12  1:24 PM      Component Value Range Status Comment   Specimen Description ABSCESS BUTTOCKS RIGHT   Final    Special Requests NONE   Final    Gram Stain     Final    Value: ABUNDANT WBC PRESENT,BOTH PMN AND MONONUCLEAR     NO SQUAMOUS EPITHELIAL CELLS SEEN     MODERATE GRAM POSITIVE COCCI     IN PAIRS MODERATE GRAM NEGATIVE RODS     FEW GRAM POSITIVE RODS   Culture     Final    Value: NO ANAEROBES ISOLATED; CULTURE IN PROGRESS FOR 5 DAYS   Report Status PENDING   Incomplete   CULTURE, BLOOD (ROUTINE X 2)     Status: Normal (Preliminary result)   Collection Time   09/01/12  5:43 PM      Component Value Range Status Comment   Specimen Description BLOOD RIGHT ARM   Final    Special Requests BOTTLES DRAWN AEROBIC ONLY 10CC   Final    Culture  Setup Time 09/02/2012 01:41   Final    Culture     Final    Value: ESCHERICHIA COLI     Note: Gram Stain Report Called to,Read Back By and Verified With: RASHIDA H@1243  ON [id number] BY [facility]   Report Status PENDING   Incomplete   CULTURE, BLOOD (ROUTINE X 2)     Status: Normal (Preliminary result)   Collection Time   09/01/12  5:43 PM      Component Value Range Status Comment   Specimen Description BLOOD RIGHT ARM   Final    Special Requests BOTTLES DRAWN AEROBIC ONLY 10CC   Final    Culture  Setup Time 09/02/2012 01:41   Final    Culture     Final     Value:        BLOOD CULTURE RECEIVED NO GROWTH TO DATE CULTURE WILL BE HELD FOR 5 DAYS BEFORE ISSUING A FINAL NEGATIVE REPORT   Report Status PENDING   Incomplete       09/03/2012, 3:08 PM     LOS: 8 days

## 2012-09-03 NOTE — Progress Notes (Signed)
ANTIBIOTIC CONSULT NOTE - INITIAL  Pharmacy Consult for Zosyn Indication: Bacteremia/Abscess  No Known Allergies  Patient Measurements: Height: 5\' 2"  (157.5 cm) Weight: 181 lb 7 oz (82.3 kg) IBW/kg (Calculated) : 50.1   Vital Signs: Temp: 98.7 F (37.1 C) (12/25 1419) Temp src: Oral (12/25 1419) BP: 101/75 mmHg (12/25 1419) Pulse Rate: 74  (12/25 1419) Intake/Output from previous day: 12/24 0701 - 12/25 0700 In: 1841.7 [P.O.:720; I.V.:1066.7; IV Piggyback:50] Out: 5 [Drains:5] Intake/Output from this shift: Total I/O In: 220 [P.O.:220] Out: -   Labs:  Basename 09/03/12 0515 09/02/12 0505  WBC 6.1 6.7  HGB 10.3* 10.7*  PLT 374 390  LABCREA -- --  CREATININE 0.65 0.68   Estimated Creatinine Clearance: 92 ml/min (by C-G formula based on Cr of 0.65). No results found for this basename: VANCOTROUGH:2,VANCOPEAK:2,VANCORANDOM:2,GENTTROUGH:2,GENTPEAK:2,GENTRANDOM:2,TOBRATROUGH:2,TOBRAPEAK:2,TOBRARND:2,AMIKACINPEAK:2,AMIKACINTROU:2,AMIKACIN:2, in the last 72 hours   Microbiology: Recent Results (from the past 720 hour(s))  CULTURE, BLOOD (ROUTINE X 2)     Status: Normal   Collection Time   08/27/12  8:10 PM      Component Value Range Status Comment   Specimen Description BLOOD RIGHT ARM   Final    Special Requests BOTTLES DRAWN AEROBIC AND ANAEROBIC 10CC   Final    Culture  Setup Time 08/28/2012 04:01   Final    Culture NO GROWTH 5 DAYS   Final    Report Status 09/03/2012 FINAL   Final   CULTURE, BLOOD (ROUTINE X 2)     Status: Normal   Collection Time   08/27/12  8:11 PM      Component Value Range Status Comment   Specimen Description BLOOD RIGHT HAND   Final    Special Requests BOTTLES DRAWN AEROBIC AND ANAEROBIC 10CC   Final    Culture  Setup Time 08/28/2012 04:01   Final    Culture NO GROWTH 5 DAYS   Final    Report Status 09/03/2012 FINAL   Final   CULTURE, ROUTINE-ABSCESS     Status: Normal (Preliminary result)   Collection Time   09/01/12  1:24 PM   Component Value Range Status Comment   Specimen Description ABSCESS BUTTOCKS RIGHT   Final    Special Requests NONE   Final    Gram Stain     Final    Value: ABUNDANT WBC PRESENT,BOTH PMN AND MONONUCLEAR     NO SQUAMOUS EPITHELIAL CELLS SEEN     MODERATE GRAM POSITIVE COCCI     IN PAIRS MODERATE GRAM NEGATIVE RODS     FEW GRAM POSITIVE RODS   Culture MULTIPLE ORGANISMS PRESENT, NONE PREDOMINANT   Final    Report Status PENDING   Incomplete   ANAEROBIC CULTURE     Status: Normal (Preliminary result)   Collection Time   09/01/12  1:24 PM      Component Value Range Status Comment   Specimen Description ABSCESS BUTTOCKS RIGHT   Final    Special Requests NONE   Final    Gram Stain     Final    Value: ABUNDANT WBC PRESENT,BOTH PMN AND MONONUCLEAR     NO SQUAMOUS EPITHELIAL CELLS SEEN     MODERATE GRAM POSITIVE COCCI     IN PAIRS MODERATE GRAM NEGATIVE RODS     FEW GRAM POSITIVE RODS   Culture     Final    Value: NO ANAEROBES ISOLATED; CULTURE IN PROGRESS FOR 5 DAYS   Report Status PENDING   Incomplete   CULTURE, BLOOD (ROUTINE X  2)     Status: Normal (Preliminary result)   Collection Time   09/01/12  5:43 PM      Component Value Range Status Comment   Specimen Description BLOOD RIGHT ARM   Final    Special Requests BOTTLES DRAWN AEROBIC ONLY 10CC   Final    Culture  Setup Time 09/02/2012 01:41   Final    Culture     Final    Value: ESCHERICHIA COLI     Note: Gram Stain Report Called to,Read Back By and Verified With: RASHIDA H@1243  ON [id number] BY [facility]   Report Status PENDING   Incomplete   CULTURE, BLOOD (ROUTINE X 2)     Status: Normal (Preliminary result)   Collection Time   09/01/12  5:43 PM      Component Value Range Status Comment   Specimen Description BLOOD RIGHT ARM   Final    Special Requests BOTTLES DRAWN AEROBIC ONLY 10CC   Final    Culture  Setup Time 09/02/2012 01:41   Final    Culture     Final    Value:        BLOOD CULTURE RECEIVED NO GROWTH TO DATE CULTURE WILL  BE HELD FOR 5 DAYS BEFORE ISSUING A FINAL NEGATIVE REPORT   Report Status PENDING   Incomplete     Medical History: History reviewed. No pertinent past medical history.  Medications:  Scheduled:    . sodium chloride  3 mL Intravenous Q12H  . [DISCONTINUED] ceFEPime (MAXIPIME) IV  2 g Intravenous Q12H  . [DISCONTINUED] metroNIDAZOLE  500 mg Oral Q8H   Assessment: Pt is a 41 y/o F with polymicrobial diverticular abscess and e coli bacteremia (sensitivities pending). WBC 6.1<<6.7, continues to spike temps (Tmax 100.1). Scr 0.65, CrCl ~ 90. Has been on Cefepime, Cipro, and Flagyl. Pharmacy is now consulted to dose Zosyn and all other anti-biotics are now stopped. Getting ID consult.   12/25 Zosyn>> 12/24 Cefepime>>12/25 12/18 Cipro>>12/24 12/18 Flagyl >>12/24  12/23 Blood Cx >> E coli  12/23 Diverticular Abscess >> Polymicrobial   Goal of Therapy:  Eradication of infection  Plan:  - Start Zosyn 3.375G IV q8h to be infused over 4 hours - F/U Micro data - F/U ID consult - Trend WBC/renal function/temp  Thank you for the consult,  Abran Duke, PharmD Clinical Pharmacist Phone: [phone] Pager: [phone] 09/03/2012 3:21 PM

## 2012-09-04 LAB — CULTURE, BLOOD (ROUTINE X 2)

## 2012-09-04 LAB — BASIC METABOLIC PANEL
Calcium: 9.4 mg/dL (ref 8.4–10.5)
GFR calc Af Amer: >90 mL/min (ref >90)
GFR calc non Af Amer: 86 mL/min — ABNORMAL LOW (ref >90)
Glucose, Bld: 103 mg/dL — ABNORMAL HIGH (ref 70–99)
Potassium: 3.6 mEq/L (ref 3.5–5.1)
Sodium: 138 mEq/L (ref 135–145)

## 2012-09-04 LAB — CULTURE, ROUTINE-ABSCESS

## 2012-09-04 LAB — IRON AND TIBC
Saturation Ratios: 11 % — ABNORMAL LOW (ref 20–55)
TIBC: 214 ug/dL — ABNORMAL LOW (ref 250–470)
UIBC: 191 ug/dL (ref 125–400)

## 2012-09-04 LAB — CBC
Hemoglobin: 11.2 g/dL — ABNORMAL LOW (ref 12.0–15.0)
MCH: 29.4 pg (ref 26.0–34.0)
MCHC: 34.7 g/dL (ref 30.0–36.0)
Platelets: 438 10*3/uL — ABNORMAL HIGH (ref 150–400)

## 2012-09-04 LAB — FOLATE: Folate: 14.8 ng/mL

## 2012-09-04 MED ORDER — PIPERACILLIN-TAZOBACTAM 3.375 G IVPB
3.3750 g | Freq: Three times a day (TID) | INTRAVENOUS | Status: DC
  Administered 2012-09-05 – 2012-09-06 (×5): 3.375 g via INTRAVENOUS
  Filled 2012-09-04 (×9): qty 50

## 2012-09-04 MED ORDER — POLYETHYLENE GLYCOL 3350 17 G PO PACK
17.0000 g | PACK | Freq: Every day | ORAL | Status: DC | PRN
  Filled 2012-09-04: qty 1

## 2012-09-04 MED ORDER — SENNOSIDES-DOCUSATE SODIUM 8.6-50 MG PO TABS
1.0000 | ORAL_TABLET | Freq: Every day | ORAL | Status: DC | PRN
  Filled 2012-09-04: qty 1

## 2012-09-04 MED ORDER — POTASSIUM CHLORIDE CRYS ER 20 MEQ PO TBCR
40.0000 meq | EXTENDED_RELEASE_TABLET | Freq: Once | ORAL | Status: AC
  Administered 2012-09-04: 40 meq via ORAL
  Filled 2012-09-04: qty 2

## 2012-09-04 MED ORDER — DOCUSATE SODIUM 100 MG PO CAPS
100.0000 mg | ORAL_CAPSULE | Freq: Two times a day (BID) | ORAL | Status: DC
  Administered 2012-09-04 – 2012-09-06 (×5): 100 mg via ORAL
  Filled 2012-09-04 (×6): qty 1

## 2012-09-04 NOTE — Progress Notes (Signed)
Acute diverticulitis  Assessment: Acute diverticulitis Improving clinically  Plan: Continue current management. Would repeat scan on Saturday and possibly do drain injection   Subjective: Feels much better today (finally) ol diet, no nausea, less pain  Objective: Vital signs in last 24 hours: Temp:  [98.7 F (37.1 C)-99.5 F (37.5 C)] 98.8 F (37.1 C) (12/26 0642) Pulse Rate:  [74-78] 78  (12/26 0642) Resp:  [18-20] 18  (12/26 0642) BP: (101-109)/(57-75) 109/57 mmHg (12/26 0642) SpO2:  [96 %-100 %] 100 % (12/26 0642) Weight:  [177 lb 8 oz (80.513 kg)] 177 lb 8 oz (80.513 kg) (12/26 4540) Last BM Date: 09/03/12  Intake/Output from previous day: 12/25 0701 - 12/26 0700 In: 812 [P.O.:660; IV Piggyback:152] Out: 12 [Drains:12] Intake/Output this shift:    General appearance: alert, cooperative and no distress GI: soft, non-tender; bowel sounds normal; no masses,  no organomegaly Drain ? Becoming feculent looked a little brown this am  Lab Results:  Results for orders placed during the hospital encounter of 08/26/12 (from the past 24 hour(s))  VITAMIN B12     Status: Normal   Collection Time   09/03/12  3:45 PM      Component Value Range   Vitamin B-12 609  211 - 911 pg/mL  FOLATE     Status: Normal   Collection Time   09/03/12  3:45 PM      Component Value Range   Folate 14.8    IRON AND TIBC     Status: Abnormal   Collection Time   09/03/12  3:45 PM      Component Value Range   Iron 23 (*) 42 - 135 ug/dL   TIBC 981 (*) 191 - 478 ug/dL   Saturation Ratios 11 (*) 20 - 55 %   UIBC 191  125 - 400 ug/dL  FERRITIN     Status: Normal   Collection Time   09/03/12  3:45 PM      Component Value Range   Ferritin 152  10 - 291 ng/mL  CBC     Status: Abnormal   Collection Time   09/04/12  5:30 AM      Component Value Range   WBC 5.4  4.0 - 10.5 K/uL   RBC 3.81 (*) 3.87 - 5.11 MIL/uL   Hemoglobin 11.2 (*) 12.0 - 15.0 g/dL   HCT 29.5 (*) 62.1 - 30.8 %   MCV 84.8   78.0 - 100.0 fL   MCH 29.4  26.0 - 34.0 pg   MCHC 34.7  30.0 - 36.0 g/dL   RDW 65.7  84.6 - 96.2 %   Platelets 438 (*) 150 - 400 K/uL  BASIC METABOLIC PANEL     Status: Abnormal   Collection Time   09/04/12  5:30 AM      Component Value Range   Sodium 138  135 - 145 mEq/L   Potassium 3.6  3.5 - 5.1 mEq/L   Chloride 101  96 - 112 mEq/L   CO2 27  19 - 32 mEq/L   Glucose, Bld 103 (*) 70 - 99 mg/dL   BUN 8  6 - 23 mg/dL   Creatinine, Ser 9.52  0.50 - 1.10 mg/dL   Calcium 9.4  8.4 - 84.1 mg/dL   GFR calc non Af Amer 86 (*) >90 mL/min   GFR calc Af Amer >90  >90 mL/min     Studies/Results Radiology     MEDS, Scheduled    . clotrimazole  1  Applicatorful Vaginal QHS  . piperacillin-tazobactam (ZOSYN)  IV  3.375 g Intravenous Q8H  . potassium chloride  40 mEq Oral Once  . sodium chloride  3 mL Intravenous Q12H       LOS: 9 days    Currie Paris, MD, [facility] Surgery, [place] [phone]   09/04/2012 8:23 AM

## 2012-09-04 NOTE — Progress Notes (Signed)
[facility] PROGRESS NOTE  Heather [name] ZOX:[id number] DOB: 1971/03/14 DOA: 08/26/2012 PCP: Default, Provider, MD  Assessment/Plan: Principal Problem:  *Acute diverticulitis Active Problems:  Abdominal pain  Perforated bowel  Colonic diverticular abscess  Anemia  Bacteremia    1.  Acute diverticulitis complicated with abscess/questionable perforation/colonic diverticular abscess: Patient presented abdominal pain, a leukocytosis, and radiologically confirmed acute sigmoid diverticulitis, complicated by microperforation. Managing with iv Ciprofloxacin/Flagyl. Surgical consultation was initally provided by Dr Axel Filler, and patient ws subsequently seen by Dr Glenna Fellows, and Dr Ray Church. Blood cultures have remained negative so far, patient continues to spike occasional fevers last one 100.1 last evening, pyrexia, although wcc has trended down. Repeat abdominal/pelvic CT of 08/31/12, showed interval increase in abscess, and patient is s/p CT-Guided pelvic drain, by Dr Jetty Peeks . General surgery's opinion is that patient may ultimately need hemicolectomy with diverting colostomy. Repeat blood cultures 09/01/13 with  E. coli, sensitivities resistant to ciprofloxacin. Continue IV Zosyn. Patient for repeat CT and during injection on Saturday. Per general surgery. ID following and appreciate temperature recommendations.  2. E coli bacteremia Likely seeded from acute diverticulitis with abscess. Patient  with no fevers overnight. White count is within normal limits. Sensitivities are resistant to ciprofloxacin and Bactrim. Continue IV Zosyn. Patient for repeat CT scan on Saturday. Awaiting results. Continue IV Zosyn for now. ID following and appreciate input and recommendations.   3. iron deficiency anemia Likely secondary to iron deficiency anemia in a menstruating female. Patient with no overt GI bleed. Hemoglobin has been stable around 11.2.  Follow  H&H.  4.prophylaxis SCDs for DVT prophylaxis.     Code Status: Full Code.  Family Communication: Discussed with patient  Disposition Plan: Per surgical team.    Brief narrative: 41 yo healthy female comes in with several days of generalized abd pain mainly in lower quadrants with loose nonbloody stools. No n/v. No fevers. Overall healthy. Ct shows diverticulitis with absess/small perf. No recent abx.Admitted for further management.    Consultants:  Dr Axel Filler, surgeon.   Dr Jetty Peeks, interventional radiologist.   Infectious disease: Dr. Ninetta Lights pending  Procedures:  Abdominal X-Rays.  Serial CT abdomen/pelvis.   CT guided pelvis drain  Antibiotics:  Ciprofloxacin 08/26/12>>>09/02/12  Flagyl 08/26/12>>> 09/03/2012  Cefepime 09/02/12 >>>> 09/03/2012  IV Zosyn 09/03/2012  HPI/Subjective: Patient states abdominal pain improving. Patient currently eating bland diet.   Objective: Vital signs in last 24 hours: Temp:  [98.4 F (36.9 C)-99.5 F (37.5 C)] 98.4 F (36.9 C) (12/26 1308) Pulse Rate:  [72-78] 72  (12/26 1308) Resp:  [18-20] 19  (12/26 1308) BP: (101-109)/(57-75) 102/68 mmHg (12/26 1308) SpO2:  [96 %-100 %] 100 % (12/26 1308) Weight:  [80.513 kg (177 lb 8 oz)] 80.513 kg (177 lb 8 oz) (12/26 8119) Weight change: -1.787 kg (-3 lb 15 oz) Last BM Date: 09/03/12  Intake/Output from previous day: 12/25 0701 - 12/26 0700 In: 812 [P.O.:660; IV Piggyback:152] Out: 12 [Drains:12] Total I/O In: 60 [P.O.:60] Out: -    Physical Exam: General: Not in acute distress, alert, communicative, fully oriented, not short of breath at rest.  HEENT:  Mild clinical pallor, no jaundice, no conjunctival injection or discharge. NECK:  Supple, JVP not seen, no carotid bruits, no palpable lymphadenopathy, no palpable goiter. CHEST:  Clinically clear to auscultation, no wheezes, no crackles. HEART:  Sounds 1 and 2 heard, normal, regular, no murmurs. ABDOMEN:   Full, soft, decreased tenderness to palpation in LLQ and suprapubic region,  bowel sounds heard. Perc drainage tube is in situ, under clean dry dressings. Right buttocks with slight erythema around the insertion of drain site. GENITALIA:  Not examined. LOWER EXTREMITIES:  No pitting edema, palpable peripheral pulses. MUSCULOSKELETAL SYSTEM:  Unremarkable.  CENTRAL NERVOUS SYSTEM:  No focal neurologic deficit on gross examination.  Lab Results:  [facility] 09/04/12 0530 09/03/12 0515  WBC 5.4 6.1  HGB 11.2* 10.3*  HCT 32.3* 29.2*  PLT 438* 374    Basename 09/04/12 0530 09/03/12 0515  NA 138 139  K 3.6 4.6  CL 101 105  CO2 27 26  GLUCOSE 103* 91  BUN 8 4*  CREATININE 0.83 0.65  CALCIUM 9.4 9.1   Recent Results (from the past 240 hour(s))  CULTURE, BLOOD (ROUTINE X 2)     Status: Normal   Collection Time   08/27/12  8:10 PM      Component Value Range Status Comment   Specimen Description BLOOD RIGHT ARM   Final    Special Requests BOTTLES DRAWN AEROBIC AND ANAEROBIC 10CC   Final    Culture  Setup Time 08/28/2012 04:01   Final    Culture NO GROWTH 5 DAYS   Final    Report Status 09/03/2012 FINAL   Final   CULTURE, BLOOD (ROUTINE X 2)     Status: Normal   Collection Time   08/27/12  8:11 PM      Component Value Range Status Comment   Specimen Description BLOOD RIGHT HAND   Final    Special Requests BOTTLES DRAWN AEROBIC AND ANAEROBIC 10CC   Final    Culture  Setup Time 08/28/2012 04:01   Final    Culture NO GROWTH 5 DAYS   Final    Report Status 09/03/2012 FINAL   Final   CULTURE, ROUTINE-ABSCESS     Status: Normal   Collection Time   09/01/12  1:24 PM      Component Value Range Status Comment   Specimen Description ABSCESS BUTTOCKS RIGHT   Final    Special Requests NONE   Final    Gram Stain     Final    Value: ABUNDANT WBC PRESENT,BOTH PMN AND MONONUCLEAR     NO SQUAMOUS EPITHELIAL CELLS SEEN     MODERATE GRAM POSITIVE COCCI     IN PAIRS MODERATE GRAM NEGATIVE RODS      FEW GRAM POSITIVE RODS   Culture     Final    Value: MULTIPLE ORGANISMS PRESENT, NONE PREDOMINANT     Note: NO STAPHYLOCOCCUS AUREUS ISOLATED NO GROUP A STREP (S.PYOGENES) ISOLATED   Report Status 09/04/2012 FINAL   Final   ANAEROBIC CULTURE     Status: Normal (Preliminary result)   Collection Time   09/01/12  1:24 PM      Component Value Range Status Comment   Specimen Description ABSCESS BUTTOCKS RIGHT   Final    Special Requests NONE   Final    Gram Stain     Final    Value: ABUNDANT WBC PRESENT,BOTH PMN AND MONONUCLEAR     NO SQUAMOUS EPITHELIAL CELLS SEEN     MODERATE GRAM POSITIVE COCCI     IN PAIRS MODERATE GRAM NEGATIVE RODS     FEW GRAM POSITIVE RODS   Culture     Final    Value: NO ANAEROBES ISOLATED; CULTURE IN PROGRESS FOR 5 DAYS   Report Status PENDING   Incomplete   CULTURE, BLOOD (ROUTINE X 2)     Status: Normal  Collection Time   09/01/12  5:43 PM      Component Value Range Status Comment   Specimen Description BLOOD RIGHT ARM   Final    Special Requests BOTTLES DRAWN AEROBIC ONLY 10CC   Final    Culture  Setup Time 09/02/2012 01:41   Final    Culture     Final    Value: ESCHERICHIA COLI     Note: Gram Stain Report Called to,Read Back By and Verified With: RASHIDA H@1243  ON [id number] BY [facility]   Report Status 09/04/2012 FINAL   Final    Organism ID, Bacteria ESCHERICHIA COLI   Final   CULTURE, BLOOD (ROUTINE X 2)     Status: Normal (Preliminary result)   Collection Time   09/01/12  5:43 PM      Component Value Range Status Comment   Specimen Description BLOOD RIGHT ARM   Final    Special Requests BOTTLES DRAWN AEROBIC ONLY 10CC   Final    Culture  Setup Time 09/02/2012 01:41   Final    Culture     Final    Value:        BLOOD CULTURE RECEIVED NO GROWTH TO DATE CULTURE WILL BE HELD FOR 5 DAYS BEFORE ISSUING A FINAL NEGATIVE REPORT   Report Status PENDING   Incomplete      Studies/Results: Dg Abd 1 View  09/03/2012  *RADIOLOGY REPORT*  Clinical Data:  Abdominal pain  ABDOMEN - 1 VIEW  Comparison: 08/26/2012  Findings: Pelvic presumed pigtail drainage catheter in place.  No bowel distention. Presence or absence of air fluid levels or free air cannot be assessed on this single supine view.  No acute osseous finding. No abnormal calcific opacity.  IMPRESSION: Nonobstructive bowel gas pattern.   Original Report Authenticated By: Christiana Pellant, M.D.     Medications: Scheduled Meds:    . clotrimazole  1 Applicatorful Vaginal QHS  . docusate sodium  100 mg Oral BID  . piperacillin-tazobactam (ZOSYN)  IV  3.375 g Intravenous Q8H  . sodium chloride  3 mL Intravenous Q12H   Continuous Infusions:   PRN Meds:.acetaminophen, HYDROmorphone (DILAUDID) injection, ibuprofen, ondansetron (ZOFRAN) IV, ondansetron, oxyCODONE, polyethylene glycol, senna-docusate    LOS: 9 days   [facility]  [facility] Pager [phone]. If 8PM-8AM, please contact night-coverage at [email or web address], password [facility] 09/04/2012, 1:33 PM  LOS: 9 days

## 2012-09-04 NOTE — Progress Notes (Signed)
  Subjective: Rt TG diverticular abscess drain placed 12/23 Doing better Output slowing  Objective: Vital signs in last 24 hours: Temp:  [98.7 F (37.1 C)-99.5 F (37.5 C)] 98.8 F (37.1 C) (12/26 0642) Pulse Rate:  [74-78] 78  (12/26 0642) Resp:  [18-20] 18  (12/26 0642) BP: (101-109)/(57-75) 109/57 mmHg (12/26 0642) SpO2:  [96 %-100 %] 100 % (12/26 0642) Weight:  [177 lb 8 oz (80.513 kg)] 177 lb 8 oz (80.513 kg) (12/26 4098) Last BM Date: 09/03/12  Intake/Output from previous day: 12/25 0701 - 12/26 0700 In: 812 [P.O.:660; IV Piggyback:152] Out: 12 [Drains:12] Intake/Output this shift: Total I/O In: 60 [P.O.:60] Out: -   PE: afeb; VSS Drain intact; site clean and dry Tender to touch No sign on inf Output purulent; 12 cc yesterday Cx gr + cocci Wbc wnl   Lab Results:   [facility] 09/04/12 0530 09/03/12 0515  WBC 5.4 6.1  HGB 11.2* 10.3*  HCT 32.3* 29.2*  PLT 438* 374   BMET  Basename 09/04/12 0530 09/03/12 0515  NA 138 139  K 3.6 4.6  CL 101 105  CO2 27 26  GLUCOSE 103* 91  BUN 8 4*  CREATININE 0.83 0.65  CALCIUM 9.4 9.1   PT/INR No results found for this basename: LABPROT:2,INR:2 in the last 72 hours ABG No results found for this basename: PHART:2,PCO2:2,PO2:2,HCO3:2 in the last 72 hours  Studies/Results: Dg Abd 1 View  09/03/2012  *RADIOLOGY REPORT*  Clinical Data: Abdominal pain  ABDOMEN - 1 VIEW  Comparison: 08/26/2012  Findings: Pelvic presumed pigtail drainage catheter in place.  No bowel distention. Presence or absence of air fluid levels or free air cannot be assessed on this single supine view.  No acute osseous finding. No abnormal calcific opacity.  IMPRESSION: Nonobstructive bowel gas pattern.   Original Report Authenticated By: Christiana Pellant, M.D.     Anti-infectives:   Assessment/Plan: s/p Rt transgluteal diverticular abscess drain 12/23 Plan per Surgery Need drain inj before pull Rec drain inj next few days   LOS: 9 days     Heather [name] A 09/04/2012

## 2012-09-04 NOTE — Progress Notes (Signed)
INFECTIOUS DISEASE PROGRESS NOTE  ID: Heather [name] is a 41 y.o. female with  Principal Problem:  *Acute diverticulitis Active Problems:  Abdominal pain  Perforated bowel  Colonic diverticular abscess  Anemia  Bacteremia  Subjective: Without complaints  Abtx:  Anti-infectives     Start     Dose/Rate Route Frequency Ordered Stop   09/03/12 1745   fluconazole (DIFLUCAN) tablet 150 mg        150 mg Oral  Once 09/03/12 1732 09/03/12 1854   09/03/12 1600  piperacillin-tazobactam (ZOSYN) IVPB 3.375 g       3.375 g 12.5 mL/hr over 240 Minutes Intravenous 3 times per day 09/03/12 1522     09/02/12 2200   ciprofloxacin (CIPRO) IVPB 400 mg  Status:  Discontinued        400 mg 200 mL/hr over 60 Minutes Intravenous Every 12 hours 09/02/12 0908 09/02/12 0911   09/02/12 2000   ciprofloxacin (CIPRO) tablet 500 mg  Status:  Discontinued        500 mg Oral 2 times daily 09/02/12 0902 09/02/12 0907   09/02/12 2000   ciprofloxacin (CIPRO) tablet 500 mg  Status:  Discontinued        500 mg Oral 2 times daily 09/02/12 0912 09/02/12 1309   09/02/12 1400   metroNIDAZOLE (FLAGYL) tablet 500 mg  Status:  Discontinued        500 mg Oral 3 times per day 09/02/12 0904 09/02/12 0908   09/02/12 1400   metroNIDAZOLE (FLAGYL) IVPB 500 mg  Status:  Discontinued        500 mg 100 mL/hr over 60 Minutes Intravenous Every 8 hours 09/02/12 0908 09/02/12 0911   09/02/12 1400   metroNIDAZOLE (FLAGYL) tablet 500 mg  Status:  Discontinued        500 mg Oral 3 times per day 09/02/12 0912 09/03/12 1456   09/02/12 1400   ceFEPIme (MAXIPIME) 2 g in dextrose 5 % 50 mL IVPB  Status:  Discontinued        2 g 100 mL/hr over 30 Minutes Intravenous Every 12 hours 09/02/12 1309 09/03/12 1456   09/02/12 1000   metroNIDAZOLE (FLAGYL) tablet 500 mg  Status:  Discontinued        500 mg Oral Every 12 hours 09/02/12 0903 09/02/12 0903   08/27/12 0700   ciprofloxacin (CIPRO) IVPB 400 mg  Status:  Discontinued        400  mg 200 mL/hr over 60 Minutes Intravenous Every 12 hours 08/27/12 0651 09/02/12 0902   08/27/12 0700   metroNIDAZOLE (FLAGYL) IVPB 500 mg  Status:  Discontinued        500 mg 100 mL/hr over 60 Minutes Intravenous Every 8 hours 08/27/12 0651 09/02/12 0902   08/27/12 0300   ciprofloxacin (CIPRO) IVPB 400 mg        400 mg 200 mL/hr over 60 Minutes Intravenous  Once 08/27/12 0256 08/27/12 0423   08/27/12 0300   metroNIDAZOLE (FLAGYL) IVPB 500 mg        500 mg 100 mL/hr over 60 Minutes Intravenous  Once 08/27/12 0256 08/27/12 0512          Medications:  Scheduled:   . clotrimazole  1 Applicatorful Vaginal QHS  . docusate sodium  100 mg Oral BID  . piperacillin-tazobactam (ZOSYN)  IV  3.375 g Intravenous Q8H  . sodium chloride  3 mL Intravenous Q12H    Objective: Vital signs in last 24 hours: Temp:  [98.4 F (  36.9 C)-99.5 F (37.5 C)] 98.4 F (36.9 C) (12/26 1308) Pulse Rate:  [72-78] 72  (12/26 1308) Resp:  [18-20] 19  (12/26 1308) BP: (101-109)/(57-75) 102/68 mmHg (12/26 1308) SpO2:  [96 %-100 %] 100 % (12/26 1308) Weight:  [80.513 kg (177 lb 8 oz)] 80.513 kg (177 lb 8 oz) (12/26 1610)   General appearance: alert, cooperative and no distress GI: normal findings: bowel sounds normal, soft, non-tender and drain site still tender, dressed.   Lab Results  Basename 09/04/12 0530 09/03/12 0515  WBC 5.4 6.1  HGB 11.2* 10.3*  HCT 32.3* 29.2*  NA 138 139  K 3.6 4.6  CL 101 105  CO2 27 26  BUN 8 4*  CREATININE 0.83 0.65  GLU -- --   Liver Panel No results found for this basename: PROT:2,ALBUMIN:2,AST:2,ALT:2,ALKPHOS:2,BILITOT:2,BILIDIR:2,IBILI:2 in the last 72 hours Sedimentation Rate No results found for this basename: ESRSEDRATE in the last 72 hours C-Reactive Protein No results found for this basename: CRP:2 in the last 72 hours  Microbiology: Recent Results (from the past 240 hour(s))  CULTURE, BLOOD (ROUTINE X 2)     Status: Normal   Collection Time    08/27/12  8:10 PM      Component Value Range Status Comment   Specimen Description BLOOD RIGHT ARM   Final    Special Requests BOTTLES DRAWN AEROBIC AND ANAEROBIC 10CC   Final    Culture  Setup Time 08/28/2012 04:01   Final    Culture NO GROWTH 5 DAYS   Final    Report Status 09/03/2012 FINAL   Final   CULTURE, BLOOD (ROUTINE X 2)     Status: Normal   Collection Time   08/27/12  8:11 PM      Component Value Range Status Comment   Specimen Description BLOOD RIGHT HAND   Final    Special Requests BOTTLES DRAWN AEROBIC AND ANAEROBIC 10CC   Final    Culture  Setup Time 08/28/2012 04:01   Final    Culture NO GROWTH 5 DAYS   Final    Report Status 09/03/2012 FINAL   Final   CULTURE, ROUTINE-ABSCESS     Status: Normal   Collection Time   09/01/12  1:24 PM      Component Value Range Status Comment   Specimen Description ABSCESS BUTTOCKS RIGHT   Final    Special Requests NONE   Final    Gram Stain     Final    Value: ABUNDANT WBC PRESENT,BOTH PMN AND MONONUCLEAR     NO SQUAMOUS EPITHELIAL CELLS SEEN     MODERATE GRAM POSITIVE COCCI     IN PAIRS MODERATE GRAM NEGATIVE RODS     FEW GRAM POSITIVE RODS   Culture     Final    Value: MULTIPLE ORGANISMS PRESENT, NONE PREDOMINANT     Note: NO STAPHYLOCOCCUS AUREUS ISOLATED NO GROUP A STREP (S.PYOGENES) ISOLATED   Report Status 09/04/2012 FINAL   Final   ANAEROBIC CULTURE     Status: Normal (Preliminary result)   Collection Time   09/01/12  1:24 PM      Component Value Range Status Comment   Specimen Description ABSCESS BUTTOCKS RIGHT   Final    Special Requests NONE   Final    Gram Stain     Final    Value: ABUNDANT WBC PRESENT,BOTH PMN AND MONONUCLEAR     NO SQUAMOUS EPITHELIAL CELLS SEEN     MODERATE GRAM POSITIVE COCCI  IN PAIRS MODERATE GRAM NEGATIVE RODS     FEW GRAM POSITIVE RODS   Culture     Final    Value: NO ANAEROBES ISOLATED; CULTURE IN PROGRESS FOR 5 DAYS   Report Status PENDING   Incomplete   CULTURE, BLOOD (ROUTINE X  2)     Status: Normal   Collection Time   09/01/12  5:43 PM      Component Value Range Status Comment   Specimen Description BLOOD RIGHT ARM   Final    Special Requests BOTTLES DRAWN AEROBIC ONLY 10CC   Final    Culture  Setup Time 09/02/2012 01:41   Final    Culture     Final    Value: ESCHERICHIA COLI     Note: Gram Stain Report Called to,Read Back By and Verified With: RASHIDA H@1243  ON 122413 BY [facility]   Report Status 09/04/2012 FINAL   Final    Organism ID, Bacteria ESCHERICHIA COLI   Final   CULTURE, BLOOD (ROUTINE X 2)     Status: Normal (Preliminary result)   Collection Time   09/01/12  5:43 PM      Component Value Range Status Comment   Specimen Description BLOOD RIGHT ARM   Final    Special Requests BOTTLES DRAWN AEROBIC ONLY 10CC   Final    Culture  Setup Time 09/02/2012 01:41   Final    Culture     Final    Value:        BLOOD CULTURE RECEIVED NO GROWTH TO DATE CULTURE WILL BE HELD FOR 5 DAYS BEFORE ISSUING A FINAL NEGATIVE REPORT   Report Status PENDING   Incomplete     Studies/Results: Dg Abd 1 View  09/03/2012  *RADIOLOGY REPORT*  Clinical Data: Abdominal pain  ABDOMEN - 1 VIEW  Comparison: 08/26/2012  Findings: Pelvic presumed pigtail drainage catheter in place.  No bowel distention. Presence or absence of air fluid levels or free air cannot be assessed on this single supine view.  No acute osseous finding. No abnormal calcific opacity.  IMPRESSION: Nonobstructive bowel gas pattern.   Original Report Authenticated By: Christiana Pellant, M.D.      Assessment/Plan: Diverticular Abscess Bacteremia (E coli)  Total days of antibiotics 9   Zosyn     Day 1  Would continue zosyn Await repeat CT on saturday (12-29)  caution with flouroquinolone use, resistance is increasing         Johny Sax Infectious Diseases [phone] 09/04/2012, 1:21 PM   LOS: 9 days

## 2012-09-05 DIAGNOSIS — A498 Other bacterial infections of unspecified site: Secondary | ICD-10-CM

## 2012-09-05 LAB — CBC
HCT: 32.8 % — ABNORMAL LOW (ref 36.0–46.0)
MCHC: 34.5 g/dL (ref 30.0–36.0)
Platelets: 479 10*3/uL — ABNORMAL HIGH (ref 150–400)
RDW: 14.1 % (ref 11.5–15.5)
WBC: 6.2 10*3/uL (ref 4.0–10.5)

## 2012-09-05 LAB — HEPATITIS C ANTIBODY: HCV Ab: NEGATIVE

## 2012-09-05 LAB — BASIC METABOLIC PANEL
Chloride: 99 mEq/L (ref 96–112)
Creatinine, Ser: 0.93 mg/dL (ref 0.50–1.10)
GFR calc Af Amer: 87 mL/min — ABNORMAL LOW (ref >90)
GFR calc non Af Amer: 75 mL/min — ABNORMAL LOW (ref >90)

## 2012-09-05 LAB — HIV ANTIBODY (ROUTINE TESTING W REFLEX): HIV: NONREACTIVE

## 2012-09-05 MED ORDER — ONDANSETRON HCL 4 MG/2ML IJ SOLN
4.0000 mg | Freq: Four times a day (QID) | INTRAMUSCULAR | Status: DC | PRN
  Administered 2012-09-06: 4 mg via INTRAVENOUS
  Filled 2012-09-05: qty 2

## 2012-09-05 MED ORDER — ONDANSETRON HCL 4 MG PO TABS: 4.0000 mg | ORAL_TABLET | Freq: Four times a day (QID) | ORAL | Status: DC | PRN

## 2012-09-05 NOTE — Progress Notes (Signed)
Subjective: Sleeping this morning, no new c/o of abdominal pain. Generally feels better per patient.  Objective: Vital signs in last 24 hours: Temp:  [98.3 F (36.8 C)-98.8 F (37.1 C)] 98.3 F (36.8 C) (12/27 0600) Pulse Rate:  [65-102] 65  (12/27 0600) Resp:  [18-19] 18  (12/27 0600) BP: (93-122)/(65-69) 122/65 mmHg (12/27 0600) SpO2:  [100 %] 100 % (12/27 0600) Weight:  [176 lb 3.2 oz (79.924 kg)] 176 lb 3.2 oz (79.924 kg) (12/27 0600) Last BM Date: 09/03/12  Intake/Output from previous day: 12/26 0701 - 12/27 0700 In: 60 [P.O.:60] Out: 5 [Drains:5] Intake/Output this shift:    General appearance: alert, cooperative, appears stated age and no distress Abdomen: soft, minimally tender, drain remains in place. Minimal output (5ml) recorded. VSS, afebrile Labs: wnl  Lab Results:   Basename 09/05/12 0505 09/04/12 0530  WBC 6.2 5.4  HGB 11.3* 11.2*  HCT 32.8* 32.3*  PLT 479* 438*   BMET  Basename 09/05/12 0505 09/04/12 0530  NA 135 138  K 4.3 3.6  CL 99 101  CO2 28 27  GLUCOSE 98 103*  BUN 9 8  CREATININE 0.93 0.83  CALCIUM 9.4 9.4   PT/INR No results found for this basename: LABPROT:2,INR:2 in the last 72 hours ABG No results found for this basename: PHART:2,PCO2:2,PO2:2,HCO3:2 in the last 72 hours  Studies/Results: Dg Abd 1 View  09/03/2012  *RADIOLOGY REPORT*  Clinical Data: Abdominal pain  ABDOMEN - 1 VIEW  Comparison: 08/26/2012  Findings: Pelvic presumed pigtail drainage catheter in place.  No bowel distention. Presence or absence of air fluid levels or free air cannot be assessed on this single supine view.  No acute osseous finding. No abnormal calcific opacity.  IMPRESSION: Nonobstructive bowel gas pattern.   Original Report Authenticated By: Christiana Pellant, M.D.     Anti-infectives: Anti-infectives     Start     Dose/Rate Route Frequency Ordered Stop   09/05/12 0300  piperacillin-tazobactam (ZOSYN) IVPB 3.375 g       3.375 g 12.5 mL/hr over  240 Minutes Intravenous Every 8 hours 09/04/12 1913     09/03/12 1745   fluconazole (DIFLUCAN) tablet 150 mg        150 mg Oral  Once 09/03/12 1732 09/03/12 1854   09/03/12 1600   piperacillin-tazobactam (ZOSYN) IVPB 3.375 g  Status:  Discontinued        3.375 g 12.5 mL/hr over 240 Minutes Intravenous 3 times per day 09/03/12 1522 09/04/12 1913   09/02/12 2200   ciprofloxacin (CIPRO) IVPB 400 mg  Status:  Discontinued        400 mg 200 mL/hr over 60 Minutes Intravenous Every 12 hours 09/02/12 0908 09/02/12 0911   09/02/12 2000   ciprofloxacin (CIPRO) tablet 500 mg  Status:  Discontinued        500 mg Oral 2 times daily 09/02/12 0902 09/02/12 0907   09/02/12 2000   ciprofloxacin (CIPRO) tablet 500 mg  Status:  Discontinued        500 mg Oral 2 times daily 09/02/12 0912 09/02/12 1309   09/02/12 1400   metroNIDAZOLE (FLAGYL) tablet 500 mg  Status:  Discontinued        500 mg Oral 3 times per day 09/02/12 0904 09/02/12 0908   09/02/12 1400   metroNIDAZOLE (FLAGYL) IVPB 500 mg  Status:  Discontinued        500 mg 100 mL/hr over 60 Minutes Intravenous Every 8 hours 09/02/12 0908 09/02/12 0911   09/02/12  1400   metroNIDAZOLE (FLAGYL) tablet 500 mg  Status:  Discontinued        500 mg Oral 3 times per day 09/02/12 0912 09/03/12 1456   09/02/12 1400   ceFEPIme (MAXIPIME) 2 g in dextrose 5 % 50 mL IVPB  Status:  Discontinued        2 g 100 mL/hr over 30 Minutes Intravenous Every 12 hours 09/02/12 1309 09/03/12 1456   09/02/12 1000   metroNIDAZOLE (FLAGYL) tablet 500 mg  Status:  Discontinued        500 mg Oral Every 12 hours 09/02/12 0903 09/02/12 0903   08/27/12 0700   ciprofloxacin (CIPRO) IVPB 400 mg  Status:  Discontinued        400 mg 200 mL/hr over 60 Minutes Intravenous Every 12 hours 08/27/12 0651 09/02/12 0902   08/27/12 0700   metroNIDAZOLE (FLAGYL) IVPB 500 mg  Status:  Discontinued        500 mg 100 mL/hr over 60 Minutes Intravenous Every 8 hours 08/27/12 0651 09/02/12  0902   08/27/12 0300   ciprofloxacin (CIPRO) IVPB 400 mg        400 mg 200 mL/hr over 60 Minutes Intravenous  Once 08/27/12 0256 08/27/12 0423   08/27/12 0300   metroNIDAZOLE (FLAGYL) IVPB 500 mg        500 mg 100 mL/hr over 60 Minutes Intravenous  Once 08/27/12 0256 08/27/12 4098          Assessment/Plan:  Patient Active Problem List  Diagnosis  . Abdominal pain  . Perforated bowel  . Acute diverticulitis  . Colonic diverticular abscess  . Anemia  . Bacteremia   s/p * No surgery found *  CT scan in am, possible drain injection Advance diet as tolerated Continue ABX (may need to go home on po abx) Probable discharge tomorrow   LOS: 10 days    [facility] Pager # [phone] 09/05/2012

## 2012-09-05 NOTE — Progress Notes (Signed)
Patient ID: Heather [name], female   DOB: [birth year], 41 y.o.   MRN: [id number]   Rt TG diverticular abscess drain placed 12/23 Output minimal at this point Afeb; wbc wnl Cx: Gr + cocci; on antibx  For CT scan 12/28 May need drain inj if Radiologist or Dr Jamey Ripa feels necessary Check CT first!  Will see pt tomorrow

## 2012-09-05 NOTE — Progress Notes (Signed)
Agree with A&P of JD,NP She improves daily, depending on ct result may be able to go home tomorrow or Sunday

## 2012-09-05 NOTE — Progress Notes (Signed)
INFECTIOUS DISEASE PROGRESS NOTE  ID: Heather [name] is a 41 y.o. female with  Principal Problem:  *Acute diverticulitis Active Problems:  Abdominal pain  Perforated bowel  Colonic diverticular abscess  Anemia  Bacteremia  Subjective: Up ambulating in hall, without complaints.   Abtx:  Anti-infectives     Start     Dose/Rate Route Frequency Ordered Stop   09/05/12 0300  piperacillin-tazobactam (ZOSYN) IVPB 3.375 g       3.375 g 12.5 mL/hr over 240 Minutes Intravenous Every 8 hours 09/04/12 1913     09/03/12 1745   fluconazole (DIFLUCAN) tablet 150 mg        150 mg Oral  Once 09/03/12 1732 09/03/12 1854   09/03/12 1600   piperacillin-tazobactam (ZOSYN) IVPB 3.375 g  Status:  Discontinued        3.375 g 12.5 mL/hr over 240 Minutes Intravenous 3 times per day 09/03/12 1522 09/04/12 1913   09/02/12 2200   ciprofloxacin (CIPRO) IVPB 400 mg  Status:  Discontinued        400 mg 200 mL/hr over 60 Minutes Intravenous Every 12 hours 09/02/12 0908 09/02/12 0911   09/02/12 2000   ciprofloxacin (CIPRO) tablet 500 mg  Status:  Discontinued        500 mg Oral 2 times daily 09/02/12 0902 09/02/12 0907   09/02/12 2000   ciprofloxacin (CIPRO) tablet 500 mg  Status:  Discontinued        500 mg Oral 2 times daily 09/02/12 0912 09/02/12 1309   09/02/12 1400   metroNIDAZOLE (FLAGYL) tablet 500 mg  Status:  Discontinued        500 mg Oral 3 times per day 09/02/12 0904 09/02/12 0908   09/02/12 1400   metroNIDAZOLE (FLAGYL) IVPB 500 mg  Status:  Discontinued        500 mg 100 mL/hr over 60 Minutes Intravenous Every 8 hours 09/02/12 0908 09/02/12 0911   09/02/12 1400   metroNIDAZOLE (FLAGYL) tablet 500 mg  Status:  Discontinued        500 mg Oral 3 times per day 09/02/12 0912 09/03/12 1456   09/02/12 1400   ceFEPIme (MAXIPIME) 2 g in dextrose 5 % 50 mL IVPB  Status:  Discontinued        2 g 100 mL/hr over 30 Minutes Intravenous Every 12 hours 09/02/12 1309 09/03/12 1456   09/02/12 1000    metroNIDAZOLE (FLAGYL) tablet 500 mg  Status:  Discontinued        500 mg Oral Every 12 hours 09/02/12 0903 09/02/12 0903   08/27/12 0700   ciprofloxacin (CIPRO) IVPB 400 mg  Status:  Discontinued        400 mg 200 mL/hr over 60 Minutes Intravenous Every 12 hours 08/27/12 0651 09/02/12 0902   08/27/12 0700   metroNIDAZOLE (FLAGYL) IVPB 500 mg  Status:  Discontinued        500 mg 100 mL/hr over 60 Minutes Intravenous Every 8 hours 08/27/12 0651 09/02/12 0902   08/27/12 0300   ciprofloxacin (CIPRO) IVPB 400 mg        400 mg 200 mL/hr over 60 Minutes Intravenous  Once 08/27/12 0256 08/27/12 0423   08/27/12 0300   metroNIDAZOLE (FLAGYL) IVPB 500 mg        500 mg 100 mL/hr over 60 Minutes Intravenous  Once 08/27/12 0256 08/27/12 0512          Medications:  Scheduled:   . clotrimazole  1 Applicatorful Vaginal QHS  .  docusate sodium  100 mg Oral BID  . piperacillin-tazobactam (ZOSYN)  IV  3.375 g Intravenous Q8H  . sodium chloride  3 mL Intravenous Q12H    Objective: Vital signs in last 24 hours: Temp:  [98.3 F (36.8 C)-98.8 F (37.1 C)] 98.3 F (36.8 C) (12/27 0600) Pulse Rate:  [65-102] 65  (12/27 0600) Resp:  [18-19] 18  (12/27 0600) BP: (93-122)/(65-69) 122/65 mmHg (12/27 0600) SpO2:  [100 %] 100 % (12/27 0600) Weight:  [79.924 kg (176 lb 3.2 oz)] 79.924 kg (176 lb 3.2 oz) (12/27 0600)   General appearance: alert, cooperative and no distress  Lab Results  Basename 09/05/12 0505 09/04/12 0530  WBC 6.2 5.4  HGB 11.3* 11.2*  HCT 32.8* 32.3*  NA 135 138  K 4.3 3.6  CL 99 101  CO2 28 27  BUN 9 8  CREATININE 0.93 0.83  GLU -- --   Liver Panel No results found for this basename: PROT:2,ALBUMIN:2,AST:2,ALT:2,ALKPHOS:2,BILITOT:2,BILIDIR:2,IBILI:2 in the last 72 hours Sedimentation Rate No results found for this basename: ESRSEDRATE in the last 72 hours C-Reactive Protein No results found for this basename: CRP:2 in the last 72 hours  Microbiology: Recent  Results (from the past 240 hour(s))  CULTURE, BLOOD (ROUTINE X 2)     Status: Normal   Collection Time   08/27/12  8:10 PM      Component Value Range Status Comment   Specimen Description BLOOD RIGHT ARM   Final    Special Requests BOTTLES DRAWN AEROBIC AND ANAEROBIC 10CC   Final    Culture  Setup Time 08/28/2012 04:01   Final    Culture NO GROWTH 5 DAYS   Final    Report Status 09/03/2012 FINAL   Final   CULTURE, BLOOD (ROUTINE X 2)     Status: Normal   Collection Time   08/27/12  8:11 PM      Component Value Range Status Comment   Specimen Description BLOOD RIGHT HAND   Final    Special Requests BOTTLES DRAWN AEROBIC AND ANAEROBIC 10CC   Final    Culture  Setup Time 08/28/2012 04:01   Final    Culture NO GROWTH 5 DAYS   Final    Report Status 09/03/2012 FINAL   Final   CULTURE, ROUTINE-ABSCESS     Status: Normal   Collection Time   09/01/12  1:24 PM      Component Value Range Status Comment   Specimen Description ABSCESS BUTTOCKS RIGHT   Final    Special Requests NONE   Final    Gram Stain     Final    Value: ABUNDANT WBC PRESENT,BOTH PMN AND MONONUCLEAR     NO SQUAMOUS EPITHELIAL CELLS SEEN     MODERATE GRAM POSITIVE COCCI     IN PAIRS MODERATE GRAM NEGATIVE RODS     FEW GRAM POSITIVE RODS   Culture     Final    Value: MULTIPLE ORGANISMS PRESENT, NONE PREDOMINANT     Note: NO STAPHYLOCOCCUS AUREUS ISOLATED NO GROUP A STREP (S.PYOGENES) ISOLATED   Report Status 09/04/2012 FINAL   Final   ANAEROBIC CULTURE     Status: Normal (Preliminary result)   Collection Time   09/01/12  1:24 PM      Component Value Range Status Comment   Specimen Description ABSCESS BUTTOCKS RIGHT   Final    Special Requests NONE   Final    Gram Stain     Final    Value: ABUNDANT WBC  PRESENT,BOTH PMN AND MONONUCLEAR     NO SQUAMOUS EPITHELIAL CELLS SEEN     MODERATE GRAM POSITIVE COCCI     IN PAIRS MODERATE GRAM NEGATIVE RODS     FEW GRAM POSITIVE RODS   Culture     Final    Value: NO ANAEROBES  ISOLATED; CULTURE IN PROGRESS FOR 5 DAYS   Report Status PENDING   Incomplete   CULTURE, BLOOD (ROUTINE X 2)     Status: Normal   Collection Time   09/01/12  5:43 PM      Component Value Range Status Comment   Specimen Description BLOOD RIGHT ARM   Final    Special Requests BOTTLES DRAWN AEROBIC ONLY 10CC   Final    Culture  Setup Time 09/02/2012 01:41   Final    Culture     Final    Value: ESCHERICHIA COLI     Note: Gram Stain Report Called to,Read Back By and Verified With: RASHIDA H@1243  ON [id number] BY [facility]   Report Status 09/04/2012 FINAL   Final    Organism ID, Bacteria ESCHERICHIA COLI   Final   CULTURE, BLOOD (ROUTINE X 2)     Status: Normal (Preliminary result)   Collection Time   09/01/12  5:43 PM      Component Value Range Status Comment   Specimen Description BLOOD RIGHT ARM   Final    Special Requests BOTTLES DRAWN AEROBIC ONLY 10CC   Final    Culture  Setup Time 09/02/2012 01:41   Final    Culture     Final    Value:        BLOOD CULTURE RECEIVED NO GROWTH TO DATE CULTURE WILL BE HELD FOR 5 DAYS BEFORE ISSUING A FINAL NEGATIVE REPORT   Report Status PENDING   Incomplete   CULTURE, BLOOD (ROUTINE X 2)     Status: Normal (Preliminary result)   Collection Time   09/03/12  3:30 PM      Component Value Range Status Comment   Specimen Description BLOOD RIGHT HAND   Final    Special Requests BOTTLES DRAWN AEROBIC ONLY 10CC   Final    Culture  Setup Time 09/04/2012 00:24   Final    Culture     Final    Value:        BLOOD CULTURE RECEIVED NO GROWTH TO DATE CULTURE WILL BE HELD FOR 5 DAYS BEFORE ISSUING A FINAL NEGATIVE REPORT   Report Status PENDING   Incomplete   CULTURE, BLOOD (ROUTINE X 2)     Status: Normal (Preliminary result)   Collection Time   09/03/12  3:35 PM      Component Value Range Status Comment   Specimen Description BLOOD RIGHT HAND   Final    Special Requests BOTTLES DRAWN AEROBIC ONLY 10C   Final    Culture  Setup Time 09/04/2012 00:24   Final     Culture     Final    Value:        BLOOD CULTURE RECEIVED NO GROWTH TO DATE CULTURE WILL BE HELD FOR 5 DAYS BEFORE ISSUING A FINAL NEGATIVE REPORT   Report Status PENDING   Incomplete     Studies/Results: No results found.   Assessment/Plan: E coli bacteremia Diverticular abscess  Total days of antibiotics 10    zosyn   Day 2  Continue zosyn Await repeat CT tomorrow (drain out 10cc) HIV, Hepatitis studies pending  Johny Sax Infectious Diseases [phone] 09/05/2012, 11:15 AM   LOS: 10 days

## 2012-09-05 NOTE — Progress Notes (Signed)
[facility] PROGRESS NOTE  Helyn Schwan ZOX:[id number] DOB: 23-Nov-1970 DOA: 08/26/2012 PCP: Default, Provider, MD  Assessment/Plan: Principal Problem:  *Acute diverticulitis Active Problems:  Abdominal pain  Perforated bowel  Colonic diverticular abscess  Anemia  Bacteremia    1.  Acute diverticulitis complicated with abscess/questionable perforation/colonic diverticular abscess: Patient presented abdominal pain, a leukocytosis, and radiologically confirmed acute sigmoid diverticulitis, complicated by microperforation. Managing with iv Ciprofloxacin/Flagyl. Surgical consultation was initally provided by Dr Axel Filler, and patient ws subsequently seen by Dr Glenna Fellows, and Dr Ray Church. Blood cultures have remained negative so far, patient continues to spike occasional fevers last one 100.1 last evening, pyrexia, although wcc has trended down. Repeat abdominal/pelvic CT of 08/31/12, showed interval increase in abscess, and patient is s/p CT-Guided pelvic drain, by Dr Jetty Peeks . General surgery's opinion is that patient may ultimately need hemicolectomy with diverting colostomy. Repeat blood cultures 09/01/13 with  E. coli, sensitivities resistant to ciprofloxacin. Continue IV Zosyn. Patient for repeat CT and drain injection on Saturday. Per general surgery. ID following and appreciate temperature recommendations.  2. E coli bacteremia Likely seeded from acute diverticulitis with abscess. Patient  with no fevers over the past 24-48 hours. White count is within normal limits. Sensitivities are resistant to ciprofloxacin and Bactrim. Continue IV Zosyn. Patient for repeat CT scan on Saturday. Awaiting results. Continue IV Zosyn for now. ID following and appreciate input and recommendations.   3. iron deficiency anemia Likely secondary to iron deficiency anemia in a menstruating female. Patient with no overt GI bleed. Hemoglobin has been stable around 11.3.  Follow  H&H.  4.prophylaxis SCDs for DVT prophylaxis.     Code Status: Full Code.  Family Communication: Discussed with patient  Disposition Plan: Per surgical team.    Brief narrative: 41 yo healthy female comes in with several days of generalized abd pain mainly in lower quadrants with loose nonbloody stools. No n/v. No fevers. Overall healthy. Ct shows diverticulitis with absess/small perf. No recent abx.Admitted for further management.    Consultants:  Dr Axel Filler, surgeon.   Dr Jetty Peeks, interventional radiologist.   Infectious disease: Dr. Ninetta Lights pending  Procedures:  Abdominal X-Rays.  Serial CT abdomen/pelvis.   CT guided pelvis drain  Antibiotics:  Ciprofloxacin 08/26/12>>>09/02/12  Flagyl 08/26/12>>> 09/03/2012  Cefepime 09/02/12 >>>> 09/03/2012  IV Zosyn 09/03/2012  HPI/Subjective: Patient states abdominal pain improving. Patient currently eating bland diet.   Objective: Vital signs in last 24 hours: Temp:  [98.3 F (36.8 C)-98.8 F (37.1 C)] 98.3 F (36.8 C) (12/27 0600) Pulse Rate:  [65-102] 65  (12/27 0600) Resp:  [18] 18  (12/27 0600) BP: (93-122)/(65-69) 122/65 mmHg (12/27 0600) SpO2:  [100 %] 100 % (12/27 0600) Weight:  [79.924 kg (176 lb 3.2 oz)] 79.924 kg (176 lb 3.2 oz) (12/27 0600) Weight change: -0.59 kg (-1 lb 4.8 oz) Last BM Date: 09/03/12  Intake/Output from previous day: 12/26 0701 - 12/27 0700 In: 60 [P.O.:60] Out: 10 [Drains:10] Total I/O In: 420 [P.O.:420] Out: 5 [Drains:5]   Physical Exam: General: Not in acute distress, alert, communicative, fully oriented, not short of breath at rest.  HEENT:  Mild clinical pallor, no jaundice, no conjunctival injection or discharge. NECK:  Supple, JVP not seen, no carotid bruits, no palpable lymphadenopathy, no palpable goiter. CHEST:  Clinically clear to auscultation, no wheezes, no crackles. HEART:  Sounds 1 and 2 heard, normal, regular, no murmurs. ABDOMEN:  Full,  soft, decreased tenderness to palpation in LLQ and suprapubic  region, bowel sounds heard. Perc drainage tube is in situ, under clean dry dressings. Right buttocks with slight erythema around the insertion of drain site. GENITALIA:  Not examined. LOWER EXTREMITIES:  No pitting edema, palpable peripheral pulses. MUSCULOSKELETAL SYSTEM:  Unremarkable.  CENTRAL NERVOUS SYSTEM:  No focal neurologic deficit on gross examination.  Lab Results:  Basename 09/05/12 0505 09/04/12 0530  WBC 6.2 5.4  HGB 11.3* 11.2*  HCT 32.8* 32.3*  PLT 479* 438*    Basename 09/05/12 0505 09/04/12 0530  NA 135 138  K 4.3 3.6  CL 99 101  CO2 28 27  GLUCOSE 98 103*  BUN 9 8  CREATININE 0.93 0.83  CALCIUM 9.4 9.4   Recent Results (from the past 240 hour(s))  CULTURE, BLOOD (ROUTINE X 2)     Status: Normal   Collection Time   08/27/12  8:10 PM      Component Value Range Status Comment   Specimen Description BLOOD RIGHT ARM   Final    Special Requests BOTTLES DRAWN AEROBIC AND ANAEROBIC 10CC   Final    Culture  Setup Time 08/28/2012 04:01   Final    Culture NO GROWTH 5 DAYS   Final    Report Status 09/03/2012 FINAL   Final   CULTURE, BLOOD (ROUTINE X 2)     Status: Normal   Collection Time   08/27/12  8:11 PM      Component Value Range Status Comment   Specimen Description BLOOD RIGHT HAND   Final    Special Requests BOTTLES DRAWN AEROBIC AND ANAEROBIC 10CC   Final    Culture  Setup Time 08/28/2012 04:01   Final    Culture NO GROWTH 5 DAYS   Final    Report Status 09/03/2012 FINAL   Final   CULTURE, ROUTINE-ABSCESS     Status: Normal   Collection Time   09/01/12  1:24 PM      Component Value Range Status Comment   Specimen Description ABSCESS BUTTOCKS RIGHT   Final    Special Requests NONE   Final    Gram Stain     Final    Value: ABUNDANT WBC PRESENT,BOTH PMN AND MONONUCLEAR     NO SQUAMOUS EPITHELIAL CELLS SEEN     MODERATE GRAM POSITIVE COCCI     IN PAIRS MODERATE GRAM NEGATIVE RODS     FEW  GRAM POSITIVE RODS   Culture     Final    Value: MULTIPLE ORGANISMS PRESENT, NONE PREDOMINANT     Note: NO STAPHYLOCOCCUS AUREUS ISOLATED NO GROUP A STREP (S.PYOGENES) ISOLATED   Report Status 09/04/2012 FINAL   Final   ANAEROBIC CULTURE     Status: Normal (Preliminary result)   Collection Time   09/01/12  1:24 PM      Component Value Range Status Comment   Specimen Description ABSCESS BUTTOCKS RIGHT   Final    Special Requests NONE   Final    Gram Stain     Final    Value: ABUNDANT WBC PRESENT,BOTH PMN AND MONONUCLEAR     NO SQUAMOUS EPITHELIAL CELLS SEEN     MODERATE GRAM POSITIVE COCCI     IN PAIRS MODERATE GRAM NEGATIVE RODS     FEW GRAM POSITIVE RODS   Culture     Final    Value: NO ANAEROBES ISOLATED; CULTURE IN PROGRESS FOR 5 DAYS   Report Status PENDING   Incomplete   CULTURE, BLOOD (ROUTINE X 2)     Status:  Normal   Collection Time   09/01/12  5:43 PM      Component Value Range Status Comment   Specimen Description BLOOD RIGHT ARM   Final    Special Requests BOTTLES DRAWN AEROBIC ONLY 10CC   Final    Culture  Setup Time 09/02/2012 01:41   Final    Culture     Final    Value: ESCHERICHIA COLI     Note: Gram Stain Report Called to,Read Back By and Verified With: RASHIDA H@1243  ON [id number] BY [facility]   Report Status 09/04/2012 FINAL   Final    Organism ID, Bacteria ESCHERICHIA COLI   Final   CULTURE, BLOOD (ROUTINE X 2)     Status: Normal (Preliminary result)   Collection Time   09/01/12  5:43 PM      Component Value Range Status Comment   Specimen Description BLOOD RIGHT ARM   Final    Special Requests BOTTLES DRAWN AEROBIC ONLY 10CC   Final    Culture  Setup Time 09/02/2012 01:41   Final    Culture     Final    Value:        BLOOD CULTURE RECEIVED NO GROWTH TO DATE CULTURE WILL BE HELD FOR 5 DAYS BEFORE ISSUING A FINAL NEGATIVE REPORT   Report Status PENDING   Incomplete   CULTURE, BLOOD (ROUTINE X 2)     Status: Normal (Preliminary result)   Collection Time   09/03/12   3:30 PM      Component Value Range Status Comment   Specimen Description BLOOD RIGHT HAND   Final    Special Requests BOTTLES DRAWN AEROBIC ONLY 10CC   Final    Culture  Setup Time 09/04/2012 00:24   Final    Culture     Final    Value:        BLOOD CULTURE RECEIVED NO GROWTH TO DATE CULTURE WILL BE HELD FOR 5 DAYS BEFORE ISSUING A FINAL NEGATIVE REPORT   Report Status PENDING   Incomplete   CULTURE, BLOOD (ROUTINE X 2)     Status: Normal (Preliminary result)   Collection Time   09/03/12  3:35 PM      Component Value Range Status Comment   Specimen Description BLOOD RIGHT HAND   Final    Special Requests BOTTLES DRAWN AEROBIC ONLY 10C   Final    Culture  Setup Time 09/04/2012 00:24   Final    Culture     Final    Value:        BLOOD CULTURE RECEIVED NO GROWTH TO DATE CULTURE WILL BE HELD FOR 5 DAYS BEFORE ISSUING A FINAL NEGATIVE REPORT   Report Status PENDING   Incomplete      Studies/Results: No results found.  Medications: Scheduled Meds:    . clotrimazole  1 Applicatorful Vaginal QHS  . docusate sodium  100 mg Oral BID  . piperacillin-tazobactam (ZOSYN)  IV  3.375 g Intravenous Q8H  . sodium chloride  3 mL Intravenous Q12H   Continuous Infusions:   PRN Meds:.acetaminophen, HYDROmorphone (DILAUDID) injection, ibuprofen, ondansetron (ZOFRAN) IV, ondansetron, oxyCODONE, polyethylene glycol, senna-docusate    LOS: 10 days   [facility]  [facility] Pager [phone]. If 8PM-8AM, please contact night-coverage at [email or web address], password [facility] 09/05/2012, 1:50 PM  LOS: 10 days

## 2012-09-05 NOTE — Progress Notes (Signed)
Noticed patient's bed was in a high position from the floor so I tried to lower it however patient refused. Encouraged patient that it was for her safety, however patient insisted that she keeps it in that position because she can move out of bed easier. Tried to encourage patient again she refused.

## 2012-09-06 ENCOUNTER — Inpatient Hospital Stay (HOSPITAL_COMMUNITY): Payer: Medicaid Other

## 2012-09-06 LAB — BASIC METABOLIC PANEL
CO2: 25 mEq/L (ref 19–32)
Chloride: 100 mEq/L (ref 96–112)
Chloride: 100 mEq/L (ref 96–112)
Creatinine, Ser: 0.79 mg/dL (ref 0.50–1.10)
GFR calc Af Amer: 84 mL/min — ABNORMAL LOW (ref >90)
GFR calc Af Amer: >90 mL/min (ref >90)
GFR calc non Af Amer: 72 mL/min — ABNORMAL LOW (ref >90)
Potassium: 5.2 mEq/L — ABNORMAL HIGH (ref 3.5–5.1)
Sodium: 135 mEq/L (ref 135–145)
Sodium: 135 mEq/L (ref 135–145)

## 2012-09-06 LAB — CBC
HCT: 33.4 % — ABNORMAL LOW (ref 36.0–46.0)
MCHC: 32.6 g/dL (ref 30.0–36.0)
Platelets: 490 10*3/uL — ABNORMAL HIGH (ref 150–400)
RDW: 14.2 % (ref 11.5–15.5)
WBC: 7.9 10*3/uL (ref 4.0–10.5)

## 2012-09-06 LAB — ANAEROBIC CULTURE

## 2012-09-06 MED ORDER — CLOTRIMAZOLE 1 % VA CREA: 1.0000 | TOPICAL_CREAM | Freq: Every day | VAGINAL | Status: DC

## 2012-09-06 MED ORDER — IOHEXOL 300 MG/ML  SOLN
20.0000 mL | INTRAMUSCULAR | Status: AC
  Administered 2012-09-06 (×2): 20 mL via ORAL

## 2012-09-06 MED ORDER — OXYCODONE HCL 5 MG PO TABS: 5.0000 mg | ORAL_TABLET | ORAL | Status: DC | PRN

## 2012-09-06 MED ORDER — IBUPROFEN 600 MG PO TABS: 600.0000 mg | ORAL_TABLET | Freq: Four times a day (QID) | ORAL | Status: DC | PRN

## 2012-09-06 MED ORDER — IOHEXOL 300 MG/ML  SOLN
100.0000 mL | Freq: Once | INTRAMUSCULAR | Status: AC | PRN
  Administered 2012-09-06: 100 mL via INTRAVENOUS

## 2012-09-06 MED ORDER — AMOXICILLIN-POT CLAVULANATE 875-125 MG PO TABS: 1.0000 | ORAL_TABLET | Freq: Two times a day (BID) | ORAL | Status: DC

## 2012-09-06 NOTE — Discharge Summary (Signed)
Physician Discharge Summary  Heather [name] GMW:[id number] DOB: 09-25-1970 DOA: 08/26/2012  PCP: Default, Provider, MD  Admit date: 08/26/2012 Discharge date: 09/06/2012  Time spent: 20 minutes  Recommendations for Outpatient Follow-up:  Patient needs surgical and interventional radiology close follow up  Discharge Diagnoses:  Principal Problem:  *Acute diverticulitis Active Problems:  Abdominal pain  Perforated bowel  Colonic diverticular abscess  Anemia  E coli bacteremia   Discharge Condition: fair  Diet recommendation: hi fiber  Filed Weights   09/04/12 0642 09/05/12 0600 09/06/12 0516  Weight: 80.513 kg (177 lb 8 oz) 79.924 kg (176 lb 3.2 oz) 79.2 kg (174 lb 9.7 oz)    History of present illness:  9 her female presented on 08/27/2029 with leukocytosis abdominal pain felt to have acute sigmoid diverticulitis with potential abscess. Heather [name] managed with surgery as per significant hospital stay below  Hospital Course:  1. Acute diverticulitis complicated with abscess/questionable perforation/colonic diverticular abscess: Patient presented abdominal pain, a leukocytosis, and radiologically confirmed acute sigmoid diverticulitis, complicated by microperforation. Managing with iv Ciprofloxacin/Flagyl. Surgical consultation was initally provided by Dr Axel Filler, and patient ws subsequently seen by Dr Glenna Fellows, and Dr Ray Church. Blood cultures have remained negative. Repeat abdominal/pelvic CT of 08/31/12, showed interval increase in abscess, and patient is s/p CT-Guided pelvic drain, by Dr Jetty Peeks [interentional radiology] . General surgery's opinion is that patient may ultimately need hemicolectomy with diverting colostomy. Repeat blood cultures 09/01/13 with E. coli, sensitivities resistant to ciprofloxacin. 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  IR  made aware and they recommended keeping tube in place until office visit 2. E coli bacteremia  Likely seeded from acute diverticulitis with abscess. Patient with no fevers overnight. White count is within normal limits. Sensitivities are resistant to ciprofloxacin and Bactrim. See above 3. iron deficiency anemia  Likely secondary to iron deficiency anemia in a menstruating female. Patient with no overt GI bleed. Hemoglobin has been stable around 11.2. Follow H&H.  4.prophylaxis  SCDs for DVT prophylaxis.  Consultants:  Dr Axel Filler, surgeon.  Dr Jetty Peeks, interventional radiologist.  Infectious disease: Dr. Ninetta Lights pending  Procedures:  Abdominal X-Rays.  Serial CT abdomen/pelvis.  CT guided pelvis drain  Antibiotics:  Ciprofloxacin 08/26/12>>>09/02/12  Flagyl 08/26/12>>> 09/03/2012  Cefepime 09/02/12 >>>> 09/03/2012  IV Zosyn 09/03/2012>>>12.28 Augmentin 12.28>>>09/17/11  Discharge Exam: Filed Vitals:   09/05/12 0600 09/05/12 1415 09/05/12 2048 09/06/12 0516  BP: 122/65 91/61 89/52  90/58  Pulse: 65 75 64 67  Temp: 98.3 F (36.8 C) 98.4 F (36.9 C) 98.4 F (36.9 C) 99.1 F (37.3 C)  TempSrc: Oral Oral Oral Oral  Resp: 18 18 18 18   Height:      Weight: 79.924 kg (176 lb 3.2 oz)   79.2 kg (174 lb 9.7 oz)  SpO2: 100% 96% 98% 100%    General: well Cardiovascular: s1 s2 no m/r/g   Discharge Instructions  Discharge Orders    Future Orders Please Complete By Expires   Diet - low sodium heart healthy      Increase activity slowly      Discharge wound care:      Comments:   Occlusive dressings to be changed q 2-3 days Please document drain output and bring it to office visit with surgeon   Leave dressing on - Keep it clean, dry, and intact until clinic visit  Medication List     As of 09/06/2012 12:50 PM    STOP taking these medications         CORRECTOL PO      TAKE these medications         amoxicillin-clavulanate 875-125 MG per tablet     Commonly known as: AUGMENTIN   Take 1 tablet by mouth 2 (two) times daily.      clotrimazole 1 % vaginal cream   Commonly known as: GYNE-LOTRIMIN   Place 1 Applicatorful vaginally at bedtime.      ibuprofen 600 MG tablet   Commonly known as: ADVIL,MOTRIN   Take 1 tablet (600 mg total) by mouth every 6 (six) hours as needed for fever.      oxyCODONE 5 MG immediate release tablet   Commonly known as: Oxy IR/ROXICODONE   Take 1 tablet (5 mg total) by mouth every 4 (four) hours as needed.           Follow-up Information    Follow up with Lajean Saver, MD. Schedule an appointment as soon as possible for a visit in 1 week. (Call for an appointmet.)    Contact information:   [address] [place] [address] [phone]           The results of significant diagnostics from this hospitalization (including imaging, microbiology, ancillary and laboratory) are listed below for reference.    Significant Diagnostic Studies: Dg Abd 1 View  09/03/2012  *RADIOLOGY REPORT*  Clinical Data: Abdominal pain  ABDOMEN - 1 VIEW  Comparison: 08/26/2012  Findings: Pelvic presumed pigtail drainage catheter in place.  No bowel distention. Presence or absence of air fluid levels or free air cannot be assessed on this single supine view.  No acute osseous finding. No abnormal calcific opacity.  IMPRESSION: Nonobstructive bowel gas pattern.   Original Report Authenticated By: Christiana Pellant, M.D.    Dg Abd 1 View  08/26/2012  *RADIOLOGY REPORT*  Clinical Data: Lower abdominal pain.  ABDOMEN - 1 VIEW  Comparison: None.  Findings: The bowel gas pattern is normal.  No radiopaque calculi identified.  Right pelvic phlebolith noted.  No evidence of abnormal mass effect.  IMPRESSION: Unremarkable bowel gas pattern.  No acute findings.   Original Report Authenticated By: Myles Rosenthal, M.D.    Ct Guided Abscess Drain  09/01/2012  *RADIOLOGY REPORT*  Clinical Data: Enlarging diverticular pelvic  abscess  CT FLUOROSCOPY GUIDED DIVERTICULAR PELVIC ABSCESS DRAIN INSERTION  Date:  09/01/2012 11:15:00  Radiologist:  M. Ruel Favors, M.D.  Medications:  100 mcg Fentanyl, 3 mg Versed  Guidance:  CT fluoroscopy  Fluoroscopy time:  10 seconds  Sedation time:  23 minutes  Contrast volume:  100 ml Omnipaque-300  Complications:  No immediate  PROCEDURE/FINDINGS:  Informed consent was obtained from the patient following explanation of the procedure, risks, benefits and alternatives. The patient understands, agrees and consents for the procedure. All questions were addressed.  A time out was performed.  Maximal barrier sterile technique utilized including caps, mask, sterile gowns, sterile gloves, large sterile drape, hand hygiene, and betadine  Previous imaging reviewed.  The patient was positioned prone. Initially noncontrast localization CT performed.  There is difficulty in delineating the pelvic abscess in relation to the adjacent colon.  100 ml contrast administered.  Repeat pelvis CT performed.  Small central diverticular abscess is better visualized.  Right transgluteal area was marked externally.  Under sterile conditions and local anesthesia, a 17 gauge 16.8 cm access  needle was advanced from a right trans gluteal approach under CT fluoroscopy into the collection.  Needle position confirmed with CT fluoroscopy.  Syringe aspiration yielded 20 ml exudative fluid. Sample sent Gram stain culture.  Guide wire advanced.  Guide wire position was reconfirmed with CT fluoroscopy.  Tract dilatation performed to insert a 10-French drain with the retention loop formed in the abscess cavity.  This was reconfirmed with CT imaging.  Syringe aspiration yielded 25 ml bloody exudative fluid. Abscess cavity was completely decompressed.  No significant residual collection.  Catheter secured with a Prolene suture and connected to external suction bulb.  Sterile dressing applied to the site.  No immediate complication.  The patient  tolerated the procedure well.  IMPRESSION:  Successful CT fluoroscopy guided diverticular pelvic abscess drain insertion.   Original Report Authenticated By: Judie Petit. Miles Costain, M.D.    Ct Abdomen Pelvis W Contrast  09/06/2012  *RADIOLOGY REPORT*  Clinical Data: History of diverticulitis with diverticular abscess treated by percutaneous drainage on 09/01/2012.  The patient now improved clinically with minimal drainage.  Follow-up CT scan to confirm resolution of abscess  CT ABDOMEN AND PELVIS WITH CONTRAST  Technique:  Multidetector CT imaging of the abdomen and pelvis was performed following the standard protocol during bolus administration of intravenous contrast.  Contrast: OMNIPAQUE IOHEXOL 300 MG/ML  SOLN  Comparison: Prior CT abdomen/pelvis 08/30/2012; CT guided abscess drain placement 09/01/2012  Findings:  Lower Chest:  Lung bases are clear.  No suspicious pulmonary nodules.  The visualized heart is within normal limits for size. No pericardial effusion.  Unremarkable distal thoracic esophagus.  Abdomen: Unremarkable CT appearance of the spleen, pancreas, adrenal glands, kidneys, and liver. Gallbladder is unremarkable. No intra or extrahepatic biliary ductal dilatation.  Normal-caliber large and small bowel throughout the abdomen.  No evidence of obstruction.  No free fluid or suspicious adenopathy.  Pelvis: There is some mild residual wall thickening in the sigmoid colon in the region of prior diverticulitis with residual stranding in the pericolonic mesentery.  Diverticular disease is noted throughout the sigmoid colon.  A pigtail drainage catheter is identified in the sigmoid mesentery adjacent to the colon.  No evidence of residual fluid collection around the drainage catheter within the anatomic pelvis to suggest residual abscess. Unremarkable uterus and ovaries with a right corpus luteum cyst and a left ovarian follicular cyst.  No free fluid or free air.  Following injection of approximately 10 ml of  dilute contrast material through the drainage catheter, delayed CT imaging demonstrates a small linear tract passing from the tube into the sigmoid colon with opacification of the sigmoid and rectal colonic lumens.  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  Bones: No acute fracture or aggressive appearing lytic or blastic osseous lesion.  Vascular: No acute arterial abnormality or significant atherosclerotic disease.  Incidental note is made of a circumaortic left renal vein.  IMPRESSION:  1.  Interval resolution of peri-sigmoid diverticular abscess. However, delayed imaging following injection of contrast material through the tube demonstrates opacification of the sigmoid colon and proximal rectum consistent with a persistent fistulous connection.  Recommend main obtaining tube in current position and switching to gravity drainage if currently on bulb suction.  Additionally, recommend cessation of intermittent flushes.  The patient will be examined in interventional radiology in 1-2 weeks with contrast injection under fluoroscopy to assess for persistent fistula.  2.  Mild - moderate residual inflammatory wall thickening and inflammation in the pericolonic fat in the region  of prior sigmoid diverticulitis and diverticular abscess.  These results were called by telephone on 09/06/2012 at 11:00 a.m. to Dr. Derrell Lolling, who verbally acknowledged these results.   Original Report Authenticated By: Malachy Moan, M.D.    Ct Abdomen Pelvis W Contrast  08/31/2012  *RADIOLOGY REPORT*  Clinical Data: Mid abdominal pain.  Nausea.  Recent diagnosis of diverticulitis with abscess.  CT ABDOMEN AND PELVIS WITH CONTRAST  Technique:  Multidetector CT imaging of the abdomen and pelvis was performed following the standard protocol during bolus administration of intravenous contrast.  Contrast: OMNIPAQUE IOHEXOL 300 MG/ML. Oral contrast was also administered.  Comparison: CT abdomen and pelvis  08/27/2012.  Findings: Edema/inflammation surrounding the proximal sigmoid colon in an area of extensive diverticulosis, associated with wall thickening.  The abscess has increased slightly in size in the interval, current measurements approximating 5.6 x 4.2 x 4.9 cm (series 5, image 66 and coronal image 95), previously approximately 4.2 x 3.6 x 4.1 cm.  No new pelvic abscess. No free intraperitoneal air.  No ascites.  Scattered diverticula in the descending colon.  Remainder of the colon normal in appearance.  Normal-appearing small bowel and stomach.  Normal appendix in the right mid abdomen, as the cecum is positioned in the right upper quadrant.  Normal appearing liver, spleen, pancreas, adrenal glands, and kidneys.  Gallbladder unremarkable by CT.  No biliary ductal dilation.  No visible aorto-iliofemoral atherosclerosis.  No significant lymphadenopathy.  Uterus and ovaries normal in appearance.  Phleboliths low in the pelvis.  Urinary bladder decompressed and unremarkable.  Visualized lung bases clear.  Heart size normal.  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  IMPRESSION:  1.  Interval slight increase in size of the diverticular abscess in the central portion of the lower pelvis, adjacent to the proximal sigmoid colon.  No free intraperitoneal air.  No new abscess. 2.  No new/acute or significant abnormalities otherwise.   Original Report Authenticated By: Hulan Saas, M.D.    Ct Abdomen Pelvis W Contrast  08/27/2012  *RADIOLOGY REPORT*  Clinical Data: Lower abdominal pain.  CT ABDOMEN AND PELVIS WITH CONTRAST  Technique:  Multidetector CT imaging of the abdomen and pelvis was performed following the standard protocol during bolus administration of intravenous contrast.  Contrast: 80mL OMNIPAQUE IOHEXOL 300 MG/ML  SOLN  Comparison: None.  Findings: Limited images through the lung bases demonstrate no significant appreciable  abnormality. The heart size is within normal limits. No pleural or pericardial effusion.  Unremarkable liver, biliary system, spleen, pancreas, adrenal glands, kidneys.  No hydronephrosis or hydroureter.  Normal appendix.  Sigmoid colon diverticulosis with evidence of diverticulitis.  Contained perforation is suggested with a 3.5 x 3.5 cm air debris collection along the medial margin of the sigmoid colon on image 67.  No free intraperitoneal air elsewhere.  No lymphadenopathy.  Small amount of free fluid dependently within the pelvis.  Thin-walled bladder.  Unremarkable CT appearance to the uterus and adnexa.  And mild degenerative changes of the left hip and lower lumbar spine.  No acute osseous finding.  IMPRESSION: Sigmoid colon diverticulitis with complicating features most in keeping with a contained perforation measuring 3.5 cm.  Discussed via telephone with Dr. Dierdre Highman at 02:30 a.m. on 08/27/2012.   Original Report Authenticated By: Jearld Lesch, M.D.    Dg Abd Acute W/chest  08/27/2012  *RADIOLOGY REPORT*  Clinical Data: Perforated bowel  ACUTE ABDOMEN SERIES (ABDOMEN 2 VIEW &  CHEST 1 VIEW)  Comparison:   CT 08/27/2012  Findings: Lungs are clear without infiltrate or effusion.  Negative for heart failure.  No free air under the diaphragm.  Contrast has progressed into the colon from the recent CT scan. Negative for bowel obstruction.  No free air.  The CT findings suggest diverticulitis with perforation and abscess.  IMPRESSION: No active cardiopulmonary disease.  No free air in the abdomen.   Original Report Authenticated By: Janeece Riggers, M.D.     Microbiology: Recent Results (from the past 240 hour(s))  CULTURE, BLOOD (ROUTINE X 2)     Status: Normal   Collection Time   08/27/12  8:10 PM      Component Value Range Status Comment   Specimen Description BLOOD RIGHT ARM   Final    Special Requests BOTTLES DRAWN AEROBIC AND ANAEROBIC 10CC   Final    Culture  Setup Time 08/28/2012 04:01   Final     Culture NO GROWTH 5 DAYS   Final    Report Status 09/03/2012 FINAL   Final   CULTURE, BLOOD (ROUTINE X 2)     Status: Normal   Collection Time   08/27/12  8:11 PM      Component Value Range Status Comment   Specimen Description BLOOD RIGHT HAND   Final    Special Requests BOTTLES DRAWN AEROBIC AND ANAEROBIC 10CC   Final    Culture  Setup Time 08/28/2012 04:01   Final    Culture NO GROWTH 5 DAYS   Final    Report Status 09/03/2012 FINAL   Final   CULTURE, ROUTINE-ABSCESS     Status: Normal   Collection Time   09/01/12  1:24 PM      Component Value Range Status Comment   Specimen Description ABSCESS BUTTOCKS RIGHT   Final    Special Requests NONE   Final    Gram Stain     Final    Value: ABUNDANT WBC PRESENT,BOTH PMN AND MONONUCLEAR     NO SQUAMOUS EPITHELIAL CELLS SEEN     MODERATE GRAM POSITIVE COCCI     IN PAIRS MODERATE GRAM NEGATIVE RODS     FEW GRAM POSITIVE RODS   Culture     Final    Value: MULTIPLE ORGANISMS PRESENT, NONE PREDOMINANT     Note: NO STAPHYLOCOCCUS AUREUS ISOLATED NO GROUP A STREP (S.PYOGENES) ISOLATED   Report Status 09/04/2012 FINAL   Final   ANAEROBIC CULTURE     Status: Normal   Collection Time   09/01/12  1:24 PM      Component Value Range Status Comment   Specimen Description ABSCESS BUTTOCKS RIGHT   Final    Special Requests NONE   Final    Gram Stain     Final    Value: ABUNDANT WBC PRESENT,BOTH PMN AND MONONUCLEAR     NO SQUAMOUS EPITHELIAL CELLS SEEN     MODERATE GRAM POSITIVE COCCI     IN PAIRS MODERATE GRAM NEGATIVE RODS     FEW GRAM POSITIVE RODS   Culture NO ANAEROBES ISOLATED   Final    Report Status 09/06/2012 FINAL   Final   CULTURE, BLOOD (ROUTINE X 2)     Status: Normal   Collection Time   09/01/12  5:43 PM      Component Value Range Status Comment   Specimen Description BLOOD RIGHT ARM   Final    Special Requests BOTTLES DRAWN AEROBIC ONLY 10CC   Final  Culture  Setup Time 09/02/2012 01:41   Final    Culture     Final     Value: ESCHERICHIA COLI     Note: Gram Stain Report Called to,Read Back By and Verified With: RASHIDA H@1243  ON [id number] BY [facility]   Report Status 09/04/2012 FINAL   Final    Organism ID, Bacteria ESCHERICHIA COLI   Final   CULTURE, BLOOD (ROUTINE X 2)     Status: Normal (Preliminary result)   Collection Time   09/01/12  5:43 PM      Component Value Range Status Comment   Specimen Description BLOOD RIGHT ARM   Final    Special Requests BOTTLES DRAWN AEROBIC ONLY 10CC   Final    Culture  Setup Time 09/02/2012 01:41   Final    Culture     Final    Value:        BLOOD CULTURE RECEIVED NO GROWTH TO DATE CULTURE WILL BE HELD FOR 5 DAYS BEFORE ISSUING A FINAL NEGATIVE REPORT   Report Status PENDING   Incomplete   CULTURE, BLOOD (ROUTINE X 2)     Status: Normal (Preliminary result)   Collection Time   09/03/12  3:30 PM      Component Value Range Status Comment   Specimen Description BLOOD RIGHT HAND   Final    Special Requests BOTTLES DRAWN AEROBIC ONLY 10CC   Final    Culture  Setup Time 09/04/2012 00:24   Final    Culture     Final    Value:        BLOOD CULTURE RECEIVED NO GROWTH TO DATE CULTURE WILL BE HELD FOR 5 DAYS BEFORE ISSUING A FINAL NEGATIVE REPORT   Report Status PENDING   Incomplete   CULTURE, BLOOD (ROUTINE X 2)     Status: Normal (Preliminary result)   Collection Time   09/03/12  3:35 PM      Component Value Range Status Comment   Specimen Description BLOOD RIGHT HAND   Final    Special Requests BOTTLES DRAWN AEROBIC ONLY 10C   Final    Culture  Setup Time 09/04/2012 00:24   Final    Culture     Final    Value:        BLOOD CULTURE RECEIVED NO GROWTH TO DATE CULTURE WILL BE HELD FOR 5 DAYS BEFORE ISSUING A FINAL NEGATIVE REPORT   Report Status PENDING   Incomplete      Labs: Basic Metabolic Panel:  Lab 09/06/12 1610 09/06/12 0620 09/05/12 0505 09/04/12 0530 09/03/12 0515  NA 135 135 135 138 139  K 4.1 5.2* 4.3 3.6 4.6  CL 100 100 99 101 105  CO2 25 27 28 27 26     GLUCOSE 102* 104* 98 103* 91  BUN 12 12 9 8  4*  CREATININE 0.79 0.96 0.93 0.83 0.65  CALCIUM 9.6 9.7 9.4 9.4 9.1  MG -- -- -- -- --  PHOS -- -- -- -- --   Liver Function Tests: No results found for this basename: AST:5,ALT:5,ALKPHOS:5,BILITOT:5,PROT:5,ALBUMIN:5 in the last 168 hours No results found for this basename: LIPASE:5,AMYLASE:5 in the last 168 hours No results found for this basename: AMMONIA:5 in the last 168 hours CBC:  Lab 09/06/12 0620 09/05/12 0505 09/04/12 0530 09/03/12 0515 09/02/12 0505 08/31/12 0615  WBC 7.9 6.2 5.4 6.1 6.7 --  NEUTROABS -- -- -- 2.9 -- 6.4  HGB 10.9* 11.3* 11.2* 10.3* 10.7* --  HCT 33.4* 32.8* 32.3* 29.2* 29.6* --  MCV 85.4 84.5 84.8 85.4 84.8 --  PLT 490* 479* 438* 374 390 --   Cardiac Enzymes: No results found for this basename: CKTOTAL:5,CKMB:5,CKMBINDEX:5,TROPONINI:5 in the last 168 hours BNP: BNP (last 3 results) No results found for this basename: PROBNP:3 in the last 8760 hours CBG: No results found for this basename: GLUCAP:5 in the last 168 hours     Signed:  Rhetta Mura  [facility] 09/06/2012, 12:50 PM

## 2012-09-06 NOTE — Progress Notes (Signed)
Discharge instructions and prescriptions given to pt . Verbalized understanding . Discharged to home  With NT to lobby ambulating , pelvic drain secured  With dressing dry and intact

## 2012-09-06 NOTE — Progress Notes (Signed)
INFECTIOUS DISEASE PROGRESS NOTE  ID: Heather [name] is a 41 y.o. female with   Principal Problem:  *Acute diverticulitis Active Problems:  Abdominal pain  Perforated bowel  Colonic diverticular abscess  Anemia  E coli bacteremia  Subjective: Wanting to go home, wants to know when she can go back to work.   Abtx:  Anti-infectives     Start     Dose/Rate Route Frequency Ordered Stop   09/05/12 0300  piperacillin-tazobactam (ZOSYN) IVPB 3.375 g       3.375 g 12.5 mL/hr over 240 Minutes Intravenous Every 8 hours 09/04/12 1913     09/03/12 1745   fluconazole (DIFLUCAN) tablet 150 mg        150 mg Oral  Once 09/03/12 1732 09/03/12 1854   09/03/12 1600   piperacillin-tazobactam (ZOSYN) IVPB 3.375 g  Status:  Discontinued        3.375 g 12.5 mL/hr over 240 Minutes Intravenous 3 times per day 09/03/12 1522 09/04/12 1913   09/02/12 2200   ciprofloxacin (CIPRO) IVPB 400 mg  Status:  Discontinued        400 mg 200 mL/hr over 60 Minutes Intravenous Every 12 hours 09/02/12 0908 09/02/12 0911   09/02/12 2000   ciprofloxacin (CIPRO) tablet 500 mg  Status:  Discontinued        500 mg Oral 2 times daily 09/02/12 0902 09/02/12 0907   09/02/12 2000   ciprofloxacin (CIPRO) tablet 500 mg  Status:  Discontinued        500 mg Oral 2 times daily 09/02/12 0912 09/02/12 1309   09/02/12 1400   metroNIDAZOLE (FLAGYL) tablet 500 mg  Status:  Discontinued        500 mg Oral 3 times per day 09/02/12 0904 09/02/12 0908   09/02/12 1400   metroNIDAZOLE (FLAGYL) IVPB 500 mg  Status:  Discontinued        500 mg 100 mL/hr over 60 Minutes Intravenous Every 8 hours 09/02/12 0908 09/02/12 0911   09/02/12 1400   metroNIDAZOLE (FLAGYL) tablet 500 mg  Status:  Discontinued        500 mg Oral 3 times per day 09/02/12 0912 09/03/12 1456   09/02/12 1400   ceFEPIme (MAXIPIME) 2 g in dextrose 5 % 50 mL IVPB  Status:  Discontinued        2 g 100 mL/hr over 30 Minutes Intravenous Every 12 hours 09/02/12 1309  09/03/12 1456   09/02/12 1000   metroNIDAZOLE (FLAGYL) tablet 500 mg  Status:  Discontinued        500 mg Oral Every 12 hours 09/02/12 0903 09/02/12 0903   08/27/12 0700   ciprofloxacin (CIPRO) IVPB 400 mg  Status:  Discontinued        400 mg 200 mL/hr over 60 Minutes Intravenous Every 12 hours 08/27/12 0651 09/02/12 0902   08/27/12 0700   metroNIDAZOLE (FLAGYL) IVPB 500 mg  Status:  Discontinued        500 mg 100 mL/hr over 60 Minutes Intravenous Every 8 hours 08/27/12 0651 09/02/12 0902   08/27/12 0300   ciprofloxacin (CIPRO) IVPB 400 mg        400 mg 200 mL/hr over 60 Minutes Intravenous  Once 08/27/12 0256 08/27/12 0423   08/27/12 0300   metroNIDAZOLE (FLAGYL) IVPB 500 mg        500 mg 100 mL/hr over 60 Minutes Intravenous  Once 08/27/12 0256 08/27/12 0512          Medications:  Scheduled:   . clotrimazole  1 Applicatorful Vaginal QHS  . docusate sodium  100 mg Oral BID  . piperacillin-tazobactam (ZOSYN)  IV  3.375 g Intravenous Q8H  . sodium chloride  3 mL Intravenous Q12H    Objective: Vital signs in last 24 hours: Temp:  [98.4 F (36.9 C)-99.1 F (37.3 C)] 99.1 F (37.3 C) (12/28 0516) Pulse Rate:  [64-75] 67  (12/28 0516) Resp:  [18] 18  (12/28 0516) BP: (89-91)/(52-61) 90/58 mmHg (12/28 0516) SpO2:  [96 %-100 %] 100 % (12/28 0516) Weight:  [79.2 kg (174 lb 9.7 oz)] 79.2 kg (174 lb 9.7 oz) (12/28 0516)   General appearance: alert, cooperative and no distress GI: normal findings: bowel sounds normal, soft, non-tender and drain with purulent d/c present.   Lab Results  Basename 09/06/12 0749 09/06/12 0620 09/05/12 0505  WBC -- 7.9 6.2  HGB -- 10.9* 11.3*  HCT -- 33.4* 32.8*  NA 135 135 --  K 4.1 5.2* --  CL 100 100 --  CO2 25 27 --  BUN 12 12 --  CREATININE 0.79 0.96 --  GLU -- -- --   Liver Panel No results found for this basename: PROT:2,ALBUMIN:2,AST:2,ALT:2,ALKPHOS:2,BILITOT:2,BILIDIR:2,IBILI:2 in the last 72 hours Sedimentation Rate No  results found for this basename: ESRSEDRATE in the last 72 hours C-Reactive Protein No results found for this basename: CRP:2 in the last 72 hours  Microbiology: Recent Results (from the past 240 hour(s))  CULTURE, BLOOD (ROUTINE X 2)     Status: Normal   Collection Time   08/27/12  8:10 PM      Component Value Range Status Comment   Specimen Description BLOOD RIGHT ARM   Final    Special Requests BOTTLES DRAWN AEROBIC AND ANAEROBIC 10CC   Final    Culture  Setup Time 08/28/2012 04:01   Final    Culture NO GROWTH 5 DAYS   Final    Report Status 09/03/2012 FINAL   Final   CULTURE, BLOOD (ROUTINE X 2)     Status: Normal   Collection Time   08/27/12  8:11 PM      Component Value Range Status Comment   Specimen Description BLOOD RIGHT HAND   Final    Special Requests BOTTLES DRAWN AEROBIC AND ANAEROBIC 10CC   Final    Culture  Setup Time 08/28/2012 04:01   Final    Culture NO GROWTH 5 DAYS   Final    Report Status 09/03/2012 FINAL   Final   CULTURE, ROUTINE-ABSCESS     Status: Normal   Collection Time   09/01/12  1:24 PM      Component Value Range Status Comment   Specimen Description ABSCESS BUTTOCKS RIGHT   Final    Special Requests NONE   Final    Gram Stain     Final    Value: ABUNDANT WBC PRESENT,BOTH PMN AND MONONUCLEAR     NO SQUAMOUS EPITHELIAL CELLS SEEN     MODERATE GRAM POSITIVE COCCI     IN PAIRS MODERATE GRAM NEGATIVE RODS     FEW GRAM POSITIVE RODS   Culture     Final    Value: MULTIPLE ORGANISMS PRESENT, NONE PREDOMINANT     Note: NO STAPHYLOCOCCUS AUREUS ISOLATED NO GROUP A STREP (S.PYOGENES) ISOLATED   Report Status 09/04/2012 FINAL   Final   ANAEROBIC CULTURE     Status: Normal   Collection Time   09/01/12  1:24 PM      Component Value  Range Status Comment   Specimen Description ABSCESS BUTTOCKS RIGHT   Final    Special Requests NONE   Final    Gram Stain     Final    Value: ABUNDANT WBC PRESENT,BOTH PMN AND MONONUCLEAR     NO SQUAMOUS EPITHELIAL CELLS  SEEN     MODERATE GRAM POSITIVE COCCI     IN PAIRS MODERATE GRAM NEGATIVE RODS     FEW GRAM POSITIVE RODS   Culture NO ANAEROBES ISOLATED   Final    Report Status 09/06/2012 FINAL   Final   CULTURE, BLOOD (ROUTINE X 2)     Status: Normal   Collection Time   09/01/12  5:43 PM      Component Value Range Status Comment   Specimen Description BLOOD RIGHT ARM   Final    Special Requests BOTTLES DRAWN AEROBIC ONLY 10CC   Final    Culture  Setup Time 09/02/2012 01:41   Final    Culture     Final    Value: ESCHERICHIA COLI     Note: Gram Stain Report Called to,Read Back By and Verified With: RASHIDA H@1243  ON [id number] BY [facility]   Report Status 09/04/2012 FINAL   Final    Organism ID, Bacteria ESCHERICHIA COLI   Final   CULTURE, BLOOD (ROUTINE X 2)     Status: Normal (Preliminary result)   Collection Time   09/01/12  5:43 PM      Component Value Range Status Comment   Specimen Description BLOOD RIGHT ARM   Final    Special Requests BOTTLES DRAWN AEROBIC ONLY 10CC   Final    Culture  Setup Time 09/02/2012 01:41   Final    Culture     Final    Value:        BLOOD CULTURE RECEIVED NO GROWTH TO DATE CULTURE WILL BE HELD FOR 5 DAYS BEFORE ISSUING A FINAL NEGATIVE REPORT   Report Status PENDING   Incomplete   CULTURE, BLOOD (ROUTINE X 2)     Status: Normal (Preliminary result)   Collection Time   09/03/12  3:30 PM      Component Value Range Status Comment   Specimen Description BLOOD RIGHT HAND   Final    Special Requests BOTTLES DRAWN AEROBIC ONLY 10CC   Final    Culture  Setup Time 09/04/2012 00:24   Final    Culture     Final    Value:        BLOOD CULTURE RECEIVED NO GROWTH TO DATE CULTURE WILL BE HELD FOR 5 DAYS BEFORE ISSUING A FINAL NEGATIVE REPORT   Report Status PENDING   Incomplete   CULTURE, BLOOD (ROUTINE X 2)     Status: Normal (Preliminary result)   Collection Time   09/03/12  3:35 PM      Component Value Range Status Comment   Specimen Description BLOOD RIGHT HAND   Final     Special Requests BOTTLES DRAWN AEROBIC ONLY 10C   Final    Culture  Setup Time 09/04/2012 00:24   Final    Culture     Final    Value:        BLOOD CULTURE RECEIVED NO GROWTH TO DATE CULTURE WILL BE HELD FOR 5 DAYS BEFORE ISSUING A FINAL NEGATIVE REPORT   Report Status PENDING   Incomplete     Studies/Results: No results found.   Assessment/Plan: Acute Diverticulitis/abscess Total Days of anbx: 11 Would change to PO augmentin Continue  augmentin for 10 more days available if questions, thanks   Johny Sax Infectious Diseases [phone] 09/06/2012, 11:05 AM   LOS: 11 days

## 2012-09-06 NOTE — Progress Notes (Signed)
  Subjective: Doing well  Objective: Vital signs in last 24 hours: Temp:  [98.4 F (36.9 C)-99.1 F (37.3 C)] 99.1 F (37.3 C) (12/28 0516) Pulse Rate:  [64-75] 67  (12/28 0516) Resp:  [18] 18  (12/28 0516) BP: (89-91)/(52-61) 90/58 mmHg (12/28 0516) SpO2:  [96 %-100 %] 100 % (12/28 0516) Weight:  [174 lb 9.7 oz (79.2 kg)] 174 lb 9.7 oz (79.2 kg) (12/28 0516) Last BM Date: 09/03/12  Intake/Output from previous day: 12/27 0701 - 12/28 0700 In: 1266 [P.O.:1140; I.V.:12; IV Piggyback:104] Out: 18 [Urine:3; Drains:15] Intake/Output this shift:    General appearance: alert, cooperative and no distress GI: soft, non-tender; bowel sounds normal; no masses,  no organomegaly  Lab Results:   [facility] 09/06/12 0620 09/05/12 0505  WBC 7.9 6.2  HGB 10.9* 11.3*  HCT 33.4* 32.8*  PLT 490* 479*    BMET  Basename 09/06/12 0749 09/06/12 0620  NA 135 135  K 4.1 5.2*  CL 100 100  CO2 25 27  GLUCOSE 102* 104*  BUN 12 12  CREATININE 0.79 0.96  CALCIUM 9.6 9.7   PT/INR No results found for this basename: LABPROT:2,INR:2 in the last 72 hours  No results found for this basename: AST:5,ALT:5,ALKPHOS:5,BILITOT:5,PROT:5,ALBUMIN:5 in the last 168 hours   Lipase     Component Value Date/Time   LIPASE 12 08/27/2012 0004     Studies/Results: No results found.  Medications:    . clotrimazole  1 Applicatorful Vaginal QHS  . docusate sodium  100 mg Oral BID  . piperacillin-tazobactam (ZOSYN)  IV  3.375 g Intravenous Q8H  . sodium chloride  3 mL Intravenous Q12H    Assessment/Plan Acute diverticulitis complicated with abscess/questionable perforation/colonic diverticular abscess E coli bacteremia Anemia Hep C and HIV are negative  PlaN:  Film reviewed by Dr. Antonieta Pert and radiology.  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  She can go home on antibiotics.  Follow up in 1 week with Dr. Antonieta Pert, and radiology will also have her follow up with  them.       LOS: 11 days    Heather [name] 09/06/2012

## 2012-09-06 NOTE — Discharge Instructions (Signed)
PATIENT INSTRUCTIONS DIVERTICULITIS  FOLLOW-UP:  Please make an appointment with your physician in 1-2 week(s).  Call your physician immediately if you have any fevers greater than 102.5,  increasing abdominal pain, or nausea/vomiting.    CAUSE:  Some people may have congenital diverticulosis, but most people develop diverticulosis around or after age 42 due to a low-fiber, high-fat diet, along with inadequate fluid intake. This is very prevalent in the industrialized world where we eat a lot of processed, low fiber foods.  Diverticuli develop due to firm stool and high pressures in the colon, along with secondary spasm, which causes outpouchings to occus where the small arteries penetrate the wall of the colon to feed the internal lining.  These occur most commonly on the left side and lower portions of the colon.  Diverticuli can then cause bleeding from the arteries at these sites of weakness when they rupture or the diveritculi can get blocked with stool and debris and become obstructed, causing diverticulosis, which is due to an infection.  When these are infected, diverticulitis, it is often treated with antibiotics and bowel rest, but when severe, recurrent, or if rupture of the colon occurs, it may require surgery.  Following appropriate dietary changes and taking the proper precautions is therefore very important.  DIET:  You should increase your dietary fiber intake and take a fiber supplement twice a day.  Make sure that you are taking a supplement that is just fiber and is not a laxative, which should be noted on the package.  Starting a fiber supplement may cause increased gas, more frequent bowel movements, and distension at first but this should improve after a couple of weeks.  Try to eat whole wheat breads and pasta, more fruits and vegetables, along with brown rice and plenty of fluids.  Avoid small undigestible food items that could get stuck in these outpouchings, such as unpopped popcorn  kernals, whole corn, small undigestible seeds, etc..  ACTIVITY:  Exercise is also a great way to prevent constipation and is encouraged.  Always make sure you take in plenty of fluids when exercising.  MEDICATIONS:  Take an over-the-counter fiber supplement as noted above twice daily.  If your symptoms don't improve with fiber and dietary changes alone your physician may also recommend psyllium or methylcellulose as well.  If your physician has placed you on an antibiotic it is critical that you take the full course of these, even if your symptoms have improved, and that you not miss any doses.  QUESTIONS:  Please feel free to call your physician or the hospital operator if you have any questions, and they will be glad to assist you.  If you have further questions it may also be helpful to meet with a dietitician.  Diverticulitis with abscess A diverticulum is a small pouch or sac on the colon. Diverticulosis is the presence of these diverticula on the colon. Diverticulitis is the irritation (inflammation) or infection of diverticula. CAUSES  The colon and its diverticula contain bacteria. If food particles block the tiny opening to a diverticulum, the bacteria inside can grow and cause an increase in pressure. This leads to infection and inflammation and is called diverticulitis. SYMPTOMS   Abdominal pain and tenderness. Usually, the pain is located on the left side of your abdomen. However, it could be located elsewhere.  Fever.  Bloating.  Feeling sick to your stomach (nausea).  Throwing up (vomiting).  Abnormal stools. DIAGNOSIS  Your caregiver will take a history and perform  a physical exam. Since many things can cause abdominal pain, other tests may be necessary. Tests may include:  Blood tests.  Urine tests.  X-ray of the abdomen.  CT scan of the abdomen. Sometimes, surgery is needed to determine if diverticulitis or other conditions are causing your symptoms. TREATMENT    Most of the time, you can be treated without surgery. Treatment includes:  Resting the bowels by only having liquids for a few days. As you improve, you will need to eat a low-fiber diet.  Intravenous (IV) fluids if you are losing body fluids (dehydrated).  Antibiotic medicines that treat infections may be given.  Pain and nausea medicine, if needed.  Surgery if the inflamed diverticulum has burst. HOME CARE INSTRUCTIONS   Try a clear liquid diet (broth, tea, or water for as long as directed by your caregiver). You may then gradually begin a low-fiber diet as tolerated. A low-fiber diet is a diet with less than 10 grams of fiber. Choose the foods below to reduce fiber in the diet:  White breads, cereals, rice, and pasta.  Cooked fruits and vegetables or soft fresh fruits and vegetables without the skin.  Ground or well-cooked tender beef, ham, veal, lamb, pork, or poultry.  Eggs and seafood.  After your diverticulitis symptoms have improved, your caregiver may put you on a high-fiber diet. A high-fiber diet includes 14 grams of fiber for every 1000 calories consumed. For a standard 2000 calorie diet, you would need 28 grams of fiber. Follow these diet guidelines to help you increase the fiber in your diet. It is important to slowly increase the amount fiber in your diet to avoid gas, constipation, and bloating.  Choose whole-grain breads, cereals, pasta, and brown rice.  Choose fresh fruits and vegetables with the skin on. Do not overcook vegetables because the more vegetables are cooked, the more fiber is lost.  Choose more nuts, seeds, legumes, dried peas, beans, and lentils.  Look for food products that have greater than 3 grams of fiber per serving on the Nutrition Facts label.  Take all medicine as directed by your caregiver.  If your caregiver has given you a follow-up appointment, it is very important that you go. Not going could result in lasting (chronic) or permanent  injury, pain, and disability. If there is any problem keeping the appointment, call to reschedule. SEEK MEDICAL CARE IF:   Your pain does not improve.  You have a hard time advancing your diet beyond clear liquids.  Your bowel movements do not return to normal. SEEK IMMEDIATE MEDICAL CARE IF:   Your pain becomes worse.  You have an oral temperature above 102 F (38.9 C), not controlled by medicine.  You have repeated vomiting.  You have bloody or black, tarry stools.  Symptoms that brought you to your caregiver become worse or are not getting better. MAKE SURE YOU:   Understand these instructions.  Will watch your condition.  Will get help right away if you are not doing well or get worse. Document Released: 06/06/2005 Document Revised: 11/19/2011 Document Reviewed: 10/02/2010 [facility] Patient Information 2013 [place], [place].   Low-Fiber Diet Fiber is found in fruits, vegetables, and grains. A low-fiber diet restricts fibrous foods that are not digested in the small intestine. A diet containing about 10 grams of fiber is considered low fiber.  PURPOSE  To prevent blockage of a partially obstructed or narrowed gastrointestinal tract.  To reduce fecal weight and volume.  To slow the movement of feces.  WHEN IS THIS DIET USED?  It may be used during the acute phase of Crohn disease, ulcerative colitis, regional enteritis, or diverticulitis.  It may be used if your intestinal or esophageal tubes are narrowing (stenosis).  It may be used as a transitional diet following surgery, injury (trauma), or illness. CHOOSING FOODS Check labels, especially on foods from the starch list. Often times, dietary fiber content is listed on the nutrition facts panel. Please ask your Registered Dietitian if you have questions about specific foods that are related to your condition, especially if the food is not listed on this handout. Breads and Starches  Allowed: White, [place], and pita  breads, plain rolls, buns, or sweet rolls, doughnuts, waffles, pancakes, bagels. Plain muffins, biscuits, matzoth. Soda, saltine, graham crackers. Pretzels, rusks, melba toast, zwieback. Cooked cereals: cornmeal, farina, or cream cereals. Dry cereals: refined corn, wheat, rice, and oat cereals (check label). Potatoes prepared any way without skins, refined macaroni, spaghetti, noodles, refined rice.  Avoid: Whole-wheat bread, rolls, and crackers. Multigrains, rye, bran seeds, nuts, or coconut. Cereals containing whole grains, multigrains, bran, coconut, nuts, raisins. Cooked or dry oatmeal. Coarse wheat cereals, granola. Cereals advertised as "high fiber." Potato skins. Whole-grain pasta, wild or brown rice. Popcorn. Vegetables  Allowed: Strained tomato and vegetable juices. Fresh lettuce, cucumber, spinach. Well-cooked or canned: asparagus, bean sprouts, broccoli, cut green beans, cauliflower, pumpkin, beets, mushrooms, olives, yellow squash, tomato, tomato sauce, zucchini, turnips.Keep servings limited to  cup.  Avoid: Fresh, cooked, or canned: artichokes, baked beans, beet greens, Brussels sprouts, corn, kale, legumes, peas, sweet potatoes. Avoid large servings of any vegetables. Fruit  Allowed: All fruit juices except prune juice. Cooked or canned fruits without skin and seeds: apricots, applesauce, cantaloupe, cherries, grapefruit, grapes, kiwi, mandarin oranges, peaches, pears, fruit cocktail, pineapple, plums, watermelon. Fresh without skin: banana, grapes, cantaloupe, avocado, cherries, pineapple, kiwi, nectarines, peaches, blueberries. Keep servings limited to  cup or 1 piece.  Avoid: Fresh: apples with or without skin, apricots, mangoes, pears, raspberries, strawberries. Prune juice and juices with pulp, stewed or dried prunes. Dried fruits, raisins, dates. Avoid large servings of all fresh fruits. Meat and Protein Substitutes  Allowed: Ground or well-cooked tender beef, ham, veal, lamb,  pork, poultry. Eggs, plain cheese. Fish, oysters, shrimp, lobster, other seafood. Liver, organ meats. Smooth nut butters.  Avoid: Tough, fibrous meats with gristle. Chunky nut butter.Cheese with seeds, nuts, or other foods not allowed. Nuts, seeds, legumes, dried peas, beans, lentils. Dairy  Allowed: All milk products except those not allowed.  Avoid: Yogurt or cheese that contains nuts, seeds, or added fruit. Soups and Combination Foods  Allowed: Bouillon, broth, or cream soups made from allowed foods. Any strained soup. Casseroles or mixed dishes made with allowed foods.  Avoid: Soups made from vegetables that are not allowed or that contain other foods not allowed. Desserts and Sweets  Allowed:Plain cakes and cookies, pie made with allowed fruit, pudding, custard, cream pie. Gelatin, fruit, ice, sherbet, frozen ice pops. Ice cream, ice milk without nuts. Plain hard candy, honey, jelly, molasses, syrup, sugar, chocolate syrup, gumdrops, marshmallows.  Avoid: Desserts, cookies, or candies that contain nuts, peanut butter, dried fruits. Jams, preserves with seeds, marmalade. Fats and Oils  Allowed:Margarine, butter, cream, mayonnaise, salad oils, plain salad dressings made from allowed foods.  Avoid: Seeds, nuts, olives. Beverages  Allowed: All, except those listed to avoid.  Avoid: Fruit juices with high pulp, prune juice. Condiments  Allowed:Ketchup, mustard, horseradish, vinegar, cream sauce, cheese sauce, cocoa powder. Spices  in moderation: allspice, basil, bay leaves, celery powder or leaves, cinnamon, cumin powder, curry powder, ginger, mace, marjoram, onion or garlic powder, oregano, paprika, parsley flakes, ground pepper, rosemary, sage, savory, tarragon, thyme, turmeric.  Avoid: Coconut, pickles. SAMPLE MENU Breakfast   cup orange juice.  1 boiled egg.  1 slice white toast.  Margarine.   cup cornflakes.  1 cup milk.  Beverage. Lunch   cup chicken  noodle soup.  2 to 3 oz sliced roast beef.  2 slices white bread.  Mayonnaise.   cup tomato juice.  1 small banana.  Beverage. Dinner  3 oz baked chicken.   cup scalloped potatoes.   cup cooked beets.  White dinner roll.  Margarine.   cup canned peaches.  Beverage. Document Released: 02/16/2002 Document Revised: 11/19/2011 Document Reviewed: 09/13/2011 [facility] Patient Information 2013 [place], [place].

## 2012-09-06 NOTE — Progress Notes (Signed)
Subjective: Pt doing fairly well; still with some soreness at transgluteal  drain site  Objective: Vital signs in last 24 hours: Temp:  [98.4 F (36.9 C)-99.1 F (37.3 C)] 99.1 F (37.3 C) (12/28 0516) Pulse Rate:  [64-75] 67  (12/28 0516) Resp:  [18] 18  (12/28 0516) BP: (89-91)/(52-61) 90/58 mmHg (12/28 0516) SpO2:  [96 %-100 %] 100 % (12/28 0516) Weight:  [174 lb 9.7 oz (79.2 kg)] 174 lb 9.7 oz (79.2 kg) (12/28 0516) Last BM Date: 09/03/12  Intake/Output from previous day: 12/27 0701 - 12/28 0700 In: 1266 [P.O.:1140; I.V.:12; IV Piggyback:104] Out: 18 [Urine:3; Drains:15] Intake/Output this shift:   Left transgluteal drain intact, insertion site ok,  output 15 cc's beige colored fluid, abscess cx's- mult organisms; blood cx- e coli  Lab Results:   [facility] 09/06/12 0620 09/05/12 0505  WBC 7.9 6.2  HGB 10.9* 11.3*  HCT 33.4* 32.8*  PLT 490* 479*   BMET  Basename 09/06/12 0749 09/06/12 0620  NA 135 135  K 4.1 5.2*  CL 100 100  CO2 25 27  GLUCOSE 102* 104*  BUN 12 12  CREATININE 0.79 0.96  CALCIUM 9.6 9.7   PT/INR No results found for this basename: LABPROT:2,INR:2 in the last 72 hours ABG No results found for this basename: PHART:2,PCO2:2,PO2:2,HCO3:2 in the last 72 hours Dg Abd 1 View  09/03/2012  *RADIOLOGY REPORT*  Clinical Data: Abdominal pain  ABDOMEN - 1 VIEW  Comparison: 08/26/2012  Findings: Pelvic presumed pigtail drainage catheter in place.  No bowel distention. Presence or absence of air fluid levels or free air cannot be assessed on this single supine view.  No acute osseous finding. No abnormal calcific opacity.  IMPRESSION: Nonobstructive bowel gas pattern.   Original Report Authenticated By: Christiana Pellant, M.D.    Dg Abd 1 View  08/26/2012  *RADIOLOGY REPORT*  Clinical Data: Lower abdominal pain.  ABDOMEN - 1 VIEW  Comparison: None.  Findings: The bowel gas pattern is normal.  No radiopaque calculi identified.  Right pelvic phlebolith noted.   No evidence of abnormal mass effect.  IMPRESSION: Unremarkable bowel gas pattern.  No acute findings.   Original Report Authenticated By: Myles Rosenthal, M.D.    Ct Guided Abscess Drain  09/01/2012  *RADIOLOGY REPORT*  Clinical Data: Enlarging diverticular pelvic abscess  CT FLUOROSCOPY GUIDED DIVERTICULAR PELVIC ABSCESS DRAIN INSERTION  Date:  09/01/2012 11:15:00  Radiologist:  M. Ruel Favors, M.D.  Medications:  100 mcg Fentanyl, 3 mg Versed  Guidance:  CT fluoroscopy  Fluoroscopy time:  10 seconds  Sedation time:  23 minutes  Contrast volume:  100 ml Omnipaque-300  Complications:  No immediate  PROCEDURE/FINDINGS:  Informed consent was obtained from the patient following explanation of the procedure, risks, benefits and alternatives. The patient understands, agrees and consents for the procedure. All questions were addressed.  A time out was performed.  Maximal barrier sterile technique utilized including caps, mask, sterile gowns, sterile gloves, large sterile drape, hand hygiene, and betadine  Previous imaging reviewed.  The patient was positioned prone. Initially noncontrast localization CT performed.  There is difficulty in delineating the pelvic abscess in relation to the adjacent colon.  100 ml contrast administered.  Repeat pelvis CT performed.  Small central diverticular abscess is better visualized.  Right transgluteal area was marked externally.  Under sterile conditions and local anesthesia, a 17 gauge 16.8 cm access needle was advanced from a right trans gluteal approach under CT fluoroscopy into the collection.  Needle position confirmed  with CT fluoroscopy.  Syringe aspiration yielded 20 ml exudative fluid. Sample sent Gram stain culture.  Guide wire advanced.  Guide wire position was reconfirmed with CT fluoroscopy.  Tract dilatation performed to insert a 10-French drain with the retention loop formed in the abscess cavity.  This was reconfirmed with CT imaging.  Syringe aspiration yielded 25 ml  bloody exudative fluid. Abscess cavity was completely decompressed.  No significant residual collection.  Catheter secured with a Prolene suture and connected to external suction bulb.  Sterile dressing applied to the site.  No immediate complication.  The patient tolerated the procedure well.  IMPRESSION:  Successful CT fluoroscopy guided diverticular pelvic abscess drain insertion.   Original Report Authenticated By: Judie Petit. Miles Costain, M.D.    Ct Abdomen Pelvis W Contrast  09/06/2012  *RADIOLOGY REPORT*  Clinical Data: History of diverticulitis with diverticular abscess treated by percutaneous drainage on 09/01/2012.  The patient now improved clinically with minimal drainage.  Follow-up CT scan to confirm resolution of abscess  CT ABDOMEN AND PELVIS WITH CONTRAST  Technique:  Multidetector CT imaging of the abdomen and pelvis was performed following the standard protocol during bolus administration of intravenous contrast.  Contrast: OMNIPAQUE IOHEXOL 300 MG/ML  SOLN  Comparison: Prior CT abdomen/pelvis 08/30/2012; CT guided abscess drain placement 09/01/2012  Findings:  Lower Chest:  Lung bases are clear.  No suspicious pulmonary nodules.  The visualized heart is within normal limits for size. No pericardial effusion.  Unremarkable distal thoracic esophagus.  Abdomen: Unremarkable CT appearance of the spleen, pancreas, adrenal glands, kidneys, and liver. Gallbladder is unremarkable. No intra or extrahepatic biliary ductal dilatation.  Normal-caliber large and small bowel throughout the abdomen.  No evidence of obstruction.  No free fluid or suspicious adenopathy.  Pelvis: There is some mild residual wall thickening in the sigmoid colon in the region of prior diverticulitis with residual stranding in the pericolonic mesentery.  Diverticular disease is noted throughout the sigmoid colon.  A pigtail drainage catheter is identified in the sigmoid mesentery adjacent to the colon.  No evidence of residual fluid  collection around the drainage catheter within the anatomic pelvis to suggest residual abscess. Unremarkable uterus and ovaries with a right corpus luteum cyst and a left ovarian follicular cyst.  No free fluid or free air.  Following injection of approximately 10 ml of dilute contrast material through the drainage catheter, delayed CT imaging demonstrates a small linear tract passing from the tube into the sigmoid colon with opacification of the sigmoid and rectal colonic lumens.  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  Bones: No acute fracture or aggressive appearing lytic or blastic osseous lesion.  Vascular: No acute arterial abnormality or significant atherosclerotic disease.  Incidental note is made of a circumaortic left renal vein.  IMPRESSION:  1.  Interval resolution of peri-sigmoid diverticular abscess. However, delayed imaging following injection of contrast material through the tube demonstrates opacification of the sigmoid colon and proximal rectum consistent with a persistent fistulous connection.  Recommend main obtaining tube in current position and switching to gravity drainage if currently on bulb suction.  Additionally, recommend cessation of intermittent flushes.  The patient will be examined in interventional radiology in 1-2 weeks with contrast injection under fluoroscopy to assess for persistent fistula.  2.  Mild - moderate residual inflammatory wall thickening and inflammation in the pericolonic fat in the region of prior sigmoid diverticulitis and diverticular abscess.  These results were called by telephone on 09/06/2012 at 11:00 a.m.  to Dr. Derrell Lolling, who verbally acknowledged these results.   Original Report Authenticated By: Malachy Moan, M.D.    Ct Abdomen Pelvis W Contrast  08/31/2012  *RADIOLOGY REPORT*  Clinical Data: Mid abdominal pain.  Nausea.  Recent diagnosis of diverticulitis with abscess.  CT ABDOMEN AND PELVIS WITH CONTRAST  Technique:   Multidetector CT imaging of the abdomen and pelvis was performed following the standard protocol during bolus administration of intravenous contrast.  Contrast: OMNIPAQUE IOHEXOL 300 MG/ML. Oral contrast was also administered.  Comparison: CT abdomen and pelvis 08/27/2012.  Findings: Edema/inflammation surrounding the proximal sigmoid colon in an area of extensive diverticulosis, associated with wall thickening.  The abscess has increased slightly in size in the interval, current measurements approximating 5.6 x 4.2 x 4.9 cm (series 5, image 66 and coronal image 95), previously approximately 4.2 x 3.6 x 4.1 cm.  No new pelvic abscess. No free intraperitoneal air.  No ascites.  Scattered diverticula in the descending colon.  Remainder of the colon normal in appearance.  Normal-appearing small bowel and stomach.  Normal appendix in the right mid abdomen, as the cecum is positioned in the right upper quadrant.  Normal appearing liver, spleen, pancreas, adrenal glands, and kidneys.  Gallbladder unremarkable by CT.  No biliary ductal dilation.  No visible aorto-iliofemoral atherosclerosis.  No significant lymphadenopathy.  Uterus and ovaries normal in appearance.  Phleboliths low in the pelvis.  Urinary bladder decompressed and unremarkable.  Visualized lung bases clear.  Heart size normal.  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  IMPRESSION:  1.  Interval slight increase in size of the diverticular abscess in the central portion of the lower pelvis, adjacent to the proximal sigmoid colon.  No free intraperitoneal air.  No new abscess. 2.  No new/acute or significant abnormalities otherwise.   Original Report Authenticated By: Hulan Saas, M.D.    Ct Abdomen Pelvis W Contrast  08/27/2012  *RADIOLOGY REPORT*  Clinical Data: Lower abdominal pain.  CT ABDOMEN AND PELVIS WITH CONTRAST  Technique:  Multidetector CT imaging of the abdomen and  pelvis was performed following the standard protocol during bolus administration of intravenous contrast.  Contrast: 80mL OMNIPAQUE IOHEXOL 300 MG/ML  SOLN  Comparison: None.  Findings: Limited images through the lung bases demonstrate no significant appreciable abnormality. The heart size is within normal limits. No pleural or pericardial effusion.  Unremarkable liver, biliary system, spleen, pancreas, adrenal glands, kidneys.  No hydronephrosis or hydroureter.  Normal appendix.  Sigmoid colon diverticulosis with evidence of diverticulitis.  Contained perforation is suggested with a 3.5 x 3.5 cm air debris collection along the medial margin of the sigmoid colon on image 67.  No free intraperitoneal air elsewhere.  No lymphadenopathy.  Small amount of free fluid dependently within the pelvis.  Thin-walled bladder.  Unremarkable CT appearance to the uterus and adnexa.  And mild degenerative changes of the left hip and lower lumbar spine.  No acute osseous finding.  IMPRESSION: Sigmoid colon diverticulitis with complicating features most in keeping with a contained perforation measuring 3.5 cm.  Discussed via telephone with Dr. Dierdre Highman at 02:30 a.m. on 08/27/2012.   Original Report Authenticated By: Jearld Lesch, M.D.    Dg Abd Acute W/chest  08/27/2012  *RADIOLOGY REPORT*  Clinical Data: Perforated bowel  ACUTE ABDOMEN SERIES (ABDOMEN 2 VIEW & CHEST 1 VIEW)  Comparison:   CT 08/27/2012  Findings: Lungs are clear without infiltrate or effusion.  Negative for heart failure.  No free air under the diaphragm.  Contrast has progressed into the colon from the recent CT scan. Negative for bowel obstruction.  No free air.  The CT findings suggest diverticulitis with perforation and abscess.  IMPRESSION: No active cardiopulmonary disease.  No free air in the abdomen.   Original Report Authenticated By: Janeece Riggers, M.D.    Studies/Results: No results found. Results for orders placed during the hospital encounter of  08/26/12  CULTURE, BLOOD (ROUTINE X 2)     Status: Normal   Collection Time   08/27/12  8:10 PM      Component Value Range Status Comment   Specimen Description BLOOD RIGHT ARM   Final    Special Requests BOTTLES DRAWN AEROBIC AND ANAEROBIC 10CC   Final    Culture  Setup Time 08/28/2012 04:01   Final    Culture NO GROWTH 5 DAYS   Final    Report Status 09/03/2012 FINAL   Final   CULTURE, BLOOD (ROUTINE X 2)     Status: Normal   Collection Time   08/27/12  8:11 PM      Component Value Range Status Comment   Specimen Description BLOOD RIGHT HAND   Final    Special Requests BOTTLES DRAWN AEROBIC AND ANAEROBIC 10CC   Final    Culture  Setup Time 08/28/2012 04:01   Final    Culture NO GROWTH 5 DAYS   Final    Report Status 09/03/2012 FINAL   Final   CULTURE, ROUTINE-ABSCESS     Status: Normal   Collection Time   09/01/12  1:24 PM      Component Value Range Status Comment   Specimen Description ABSCESS BUTTOCKS RIGHT   Final    Special Requests NONE   Final    Gram Stain     Final    Value: ABUNDANT WBC PRESENT,BOTH PMN AND MONONUCLEAR     NO SQUAMOUS EPITHELIAL CELLS SEEN     MODERATE GRAM POSITIVE COCCI     IN PAIRS MODERATE GRAM NEGATIVE RODS     FEW GRAM POSITIVE RODS   Culture     Final    Value: MULTIPLE ORGANISMS PRESENT, NONE PREDOMINANT     Note: NO STAPHYLOCOCCUS AUREUS ISOLATED NO GROUP A STREP (S.PYOGENES) ISOLATED   Report Status 09/04/2012 FINAL   Final   ANAEROBIC CULTURE     Status: Normal   Collection Time   09/01/12  1:24 PM      Component Value Range Status Comment   Specimen Description ABSCESS BUTTOCKS RIGHT   Final    Special Requests NONE   Final    Gram Stain     Final    Value: ABUNDANT WBC PRESENT,BOTH PMN AND MONONUCLEAR     NO SQUAMOUS EPITHELIAL CELLS SEEN     MODERATE GRAM POSITIVE COCCI     IN PAIRS MODERATE GRAM NEGATIVE RODS     FEW GRAM POSITIVE RODS   Culture NO ANAEROBES ISOLATED   Final    Report Status 09/06/2012 FINAL   Final     CULTURE, BLOOD (ROUTINE X 2)     Status: Normal   Collection Time   09/01/12  5:43 PM      Component Value Range Status Comment   Specimen Description BLOOD RIGHT ARM   Final    Special Requests BOTTLES DRAWN AEROBIC ONLY 10CC   Final    Culture  Setup Time 09/02/2012 01:41   Final  Culture     Final    Value: ESCHERICHIA COLI     Note: Gram Stain Report Called to,Read Back By and Verified With: RASHIDA H@1243  ON [id number] BY [facility]   Report Status 09/04/2012 FINAL   Final    Organism ID, Bacteria ESCHERICHIA COLI   Final   CULTURE, BLOOD (ROUTINE X 2)     Status: Normal (Preliminary result)   Collection Time   09/01/12  5:43 PM      Component Value Range Status Comment   Specimen Description BLOOD RIGHT ARM   Final    Special Requests BOTTLES DRAWN AEROBIC ONLY 10CC   Final    Culture  Setup Time 09/02/2012 01:41   Final    Culture     Final    Value:        BLOOD CULTURE RECEIVED NO GROWTH TO DATE CULTURE WILL BE HELD FOR 5 DAYS BEFORE ISSUING A FINAL NEGATIVE REPORT   Report Status PENDING   Incomplete   CULTURE, BLOOD (ROUTINE X 2)     Status: Normal (Preliminary result)   Collection Time   09/03/12  3:30 PM      Component Value Range Status Comment   Specimen Description BLOOD RIGHT HAND   Final    Special Requests BOTTLES DRAWN AEROBIC ONLY 10CC   Final    Culture  Setup Time 09/04/2012 00:24   Final    Culture     Final    Value:        BLOOD CULTURE RECEIVED NO GROWTH TO DATE CULTURE WILL BE HELD FOR 5 DAYS BEFORE ISSUING A FINAL NEGATIVE REPORT   Report Status PENDING   Incomplete   CULTURE, BLOOD (ROUTINE X 2)     Status: Normal (Preliminary result)   Collection Time   09/03/12  3:35 PM      Component Value Range Status Comment   Specimen Description BLOOD RIGHT HAND   Final    Special Requests BOTTLES DRAWN AEROBIC ONLY 10C   Final    Culture  Setup Time 09/04/2012 00:24   Final    Culture     Final    Value:        BLOOD CULTURE RECEIVED NO GROWTH TO DATE CULTURE  WILL BE HELD FOR 5 DAYS BEFORE ISSUING A FINAL NEGATIVE REPORT   Report Status PENDING   Incomplete     Anti-infectives: Anti-infectives     Start     Dose/Rate Route Frequency Ordered Stop   09/05/12 0300   piperacillin-tazobactam (ZOSYN) IVPB 3.375 g        3.375 g 12.5 mL/hr over 240 Minutes Intravenous Every 8 hours 09/04/12 1913     09/03/12 1745   fluconazole (DIFLUCAN) tablet 150 mg        150 mg Oral  Once 09/03/12 1732 09/03/12 1854   09/03/12 1600   piperacillin-tazobactam (ZOSYN) IVPB 3.375 g  Status:  Discontinued        3.375 g 12.5 mL/hr over 240 Minutes Intravenous 3 times per day 09/03/12 1522 09/04/12 1913   09/02/12 2200   ciprofloxacin (CIPRO) IVPB 400 mg  Status:  Discontinued        400 mg 200 mL/hr over 60 Minutes Intravenous Every 12 hours 09/02/12 0908 09/02/12 0911   09/02/12 2000   ciprofloxacin (CIPRO) tablet 500 mg  Status:  Discontinued        500 mg Oral 2 times daily 09/02/12 0902 09/02/12 0907   09/02/12 2000  ciprofloxacin (CIPRO) tablet 500 mg  Status:  Discontinued        500 mg Oral 2 times daily 09/02/12 0912 09/02/12 1309   09/02/12 1400   metroNIDAZOLE (FLAGYL) tablet 500 mg  Status:  Discontinued        500 mg Oral 3 times per day 09/02/12 0904 09/02/12 0908   09/02/12 1400   metroNIDAZOLE (FLAGYL) IVPB 500 mg  Status:  Discontinued        500 mg 100 mL/hr over 60 Minutes Intravenous Every 8 hours 09/02/12 0908 09/02/12 0911   09/02/12 1400   metroNIDAZOLE (FLAGYL) tablet 500 mg  Status:  Discontinued        500 mg Oral 3 times per day 09/02/12 0912 09/03/12 1456   09/02/12 1400   ceFEPIme (MAXIPIME) 2 g in dextrose 5 % 50 mL IVPB  Status:  Discontinued        2 g 100 mL/hr over 30 Minutes Intravenous Every 12 hours 09/02/12 1309 09/03/12 1456   09/02/12 1000   metroNIDAZOLE (FLAGYL) tablet 500 mg  Status:  Discontinued        500 mg Oral Every 12 hours 09/02/12 0903 09/02/12 0903   08/27/12 0700   ciprofloxacin (CIPRO) IVPB 400 mg   Status:  Discontinued        400 mg 200 mL/hr over 60 Minutes Intravenous Every 12 hours 08/27/12 0651 09/02/12 0902   08/27/12 0700   metroNIDAZOLE (FLAGYL) IVPB 500 mg  Status:  Discontinued        500 mg 100 mL/hr over 60 Minutes Intravenous Every 8 hours 08/27/12 0651 09/02/12 0902   08/27/12 0300   ciprofloxacin (CIPRO) IVPB 400 mg        400 mg 200 mL/hr over 60 Minutes Intravenous  Once 08/27/12 0256 08/27/12 0423   08/27/12 0300   metroNIDAZOLE (FLAGYL) IVPB 500 mg        500 mg 100 mL/hr over 60 Minutes Intravenous  Once 08/27/12 0256 08/27/12 8119          Assessment/Plan: S/p pelvic/diverticular abscess drainage 12/23; f/u CT /drain injection today reveals resolution of abscess but comm of abscess cavity with sigmoid colon/prox rectum. Pt will cont with gravity drain bag (no saline flushes) and return in 1-2 weeks for repeat drain injection. Pt should record output of drain and change dressing every 1-2 days.  ALLRED,D [facility] 09/06/2012

## 2012-09-08 LAB — CULTURE, BLOOD (ROUTINE X 2): Culture: NO GROWTH

## 2012-09-10 LAB — CULTURE, BLOOD (ROUTINE X 2): Culture: NO GROWTH

## 2012-09-11 ENCOUNTER — Other Ambulatory Visit (INDEPENDENT_AMBULATORY_CARE_PROVIDER_SITE_OTHER): Payer: Self-pay | Admitting: General Surgery

## 2012-09-11 DIAGNOSIS — K5792 Diverticulitis of intestine, part unspecified, without perforation or abscess without bleeding: Secondary | ICD-10-CM

## 2012-09-16 ENCOUNTER — Other Ambulatory Visit (INDEPENDENT_AMBULATORY_CARE_PROVIDER_SITE_OTHER): Payer: Self-pay | Admitting: General Surgery

## 2012-09-16 ENCOUNTER — Encounter (INDEPENDENT_AMBULATORY_CARE_PROVIDER_SITE_OTHER): Payer: Self-pay | Admitting: General Surgery

## 2012-09-16 ENCOUNTER — Ambulatory Visit (INDEPENDENT_AMBULATORY_CARE_PROVIDER_SITE_OTHER): Payer: Self-pay | Admitting: General Surgery

## 2012-09-16 ENCOUNTER — Ambulatory Visit (HOSPITAL_COMMUNITY)
Admission: RE | Admit: 2012-09-16 | Discharge: 2012-09-16 | Disposition: A | Discharge status: Home / Self Care | Payer: Medicaid Other | Source: Ambulatory Visit | Attending: General Surgery | Admitting: General Surgery

## 2012-09-16 VITALS — BP 110/60 | HR 68 | Temp 97.6°F | Resp 18 | Ht 62.0 in | Wt 178.0 lb

## 2012-09-16 DIAGNOSIS — K5792 Diverticulitis of intestine, part unspecified, without perforation or abscess without bleeding: Secondary | ICD-10-CM

## 2012-09-16 DIAGNOSIS — K632 Fistula of intestine: Secondary | ICD-10-CM | POA: Insufficient documentation

## 2012-09-16 DIAGNOSIS — K5732 Diverticulitis of large intestine without perforation or abscess without bleeding: Secondary | ICD-10-CM

## 2012-09-16 DIAGNOSIS — Z8719 Personal history of other diseases of the digestive system: Secondary | ICD-10-CM | POA: Insufficient documentation

## 2012-09-16 DIAGNOSIS — K5720 Diverticulitis of large intestine with perforation and abscess without bleeding: Secondary | ICD-10-CM

## 2012-09-16 MED ORDER — IOHEXOL 300 MG/ML  SOLN
50.0000 mL | Freq: Once | INTRAMUSCULAR | Status: AC | PRN
  Administered 2012-09-16: 15 mL via INTRAVENOUS

## 2012-09-16 NOTE — Procedures (Signed)
Interventional Radiology Procedure Note  Procedure: Tube injection revealing fistulous connection with the sigmoid colon Complications: None Recommendations: - Maintain tube to bag drainage - No flushes - Return to IR in 2 weeks for repeat injection  Signed,  Sterling Big, MD Vascular & Interventional Radiologist [facility]

## 2012-09-16 NOTE — Progress Notes (Signed)
Subjective:     Patient ID: Heather [name], female   DOB: [birth year], 42 y.o.   MRN: [id number]  Patient is a 42 year old female was admitted to hospital recently for a diverticular abscess. Patient had a drain placed on hospital. She's been on oral antibiotics with today being her last day.  She has complained no fevers chills home and no diarrhea. Patient was recently seen today by radiology and had a contrast study done through the drainage tube which revealed a small diverticulum. They recommended continuing the drain for 2 weeks with a study at that time.  Patient is requested to return to work at this time.  Wound Check     Review of Systems  Constitutional: Negative.   HENT: Negative.   Respiratory: Negative.   Cardiovascular: Negative.   Gastrointestinal: Negative.   Neurological: Negative.   Hematological: Negative.        Objective:   Physical Exam  Constitutional: She is oriented to person, place, and time. She appears well-developed and well-nourished.  HENT:  Head: Normocephalic and atraumatic.  Eyes: Conjunctivae normal are normal.  Neck: Normal range of motion.  Cardiovascular: Normal heart sounds.   Pulmonary/Chest: Effort normal and breath sounds normal.  Abdominal: Bowel sounds are normal. She exhibits no distension. There is no tenderness. There is no rebound and no guarding.  Musculoskeletal: Normal range of motion.  Neurological: She is alert and oriented to person, place, and time.       Assessment:     42 year old female with diverticulitis.    Plan:     1. Patient followup around 1.21.14 with Dr. Jamey Ripa 2. Patient okay to return to work at full duty

## 2012-09-23 ENCOUNTER — Telehealth (INDEPENDENT_AMBULATORY_CARE_PROVIDER_SITE_OTHER): Payer: Self-pay | Admitting: General Surgery

## 2012-09-23 NOTE — Telephone Encounter (Signed)
Called patient re: refill on pain medication.Marland KitchenLMOM to call back

## 2012-09-30 ENCOUNTER — Other Ambulatory Visit (INDEPENDENT_AMBULATORY_CARE_PROVIDER_SITE_OTHER): Payer: Self-pay | Admitting: General Surgery

## 2012-09-30 ENCOUNTER — Encounter (HOSPITAL_COMMUNITY): Payer: Self-pay | Admitting: Pharmacy Technician

## 2012-09-30 ENCOUNTER — Ambulatory Visit (HOSPITAL_COMMUNITY)
Admission: RE | Admit: 2012-09-30 | Discharge: 2012-09-30 | Disposition: A | Discharge status: Home / Self Care | Payer: Medicaid Other | Source: Ambulatory Visit | Attending: General Surgery | Admitting: General Surgery

## 2012-09-30 DIAGNOSIS — K5720 Diverticulitis of large intestine with perforation and abscess without bleeding: Secondary | ICD-10-CM

## 2012-09-30 DIAGNOSIS — K632 Fistula of intestine: Secondary | ICD-10-CM | POA: Insufficient documentation

## 2012-09-30 DIAGNOSIS — L0291 Cutaneous abscess, unspecified: Secondary | ICD-10-CM

## 2012-09-30 MED ORDER — IOHEXOL 300 MG/ML  SOLN
50.0000 mL | Freq: Once | INTRAMUSCULAR | Status: AC | PRN
  Administered 2012-09-30: 10 mL via INTRAVENOUS

## 2012-09-30 NOTE — Procedures (Signed)
Injection of existing R TG approach abscess drainage catheter demonstrates persistent communication with the adjacent loop of sigmoid colon.  As such, the drain was left in place.  No immediate post procedural complications.

## 2012-10-01 ENCOUNTER — Telehealth (INDEPENDENT_AMBULATORY_CARE_PROVIDER_SITE_OTHER): Payer: Self-pay | Admitting: General Surgery

## 2012-10-01 NOTE — Telephone Encounter (Signed)
Message copied by Liliana Cline on Wed Oct 01, 2012 10:40 AM ------      Message from: Currie Paris      Created: Wed Oct 01, 2012  8:07 AM       I talked to Dr Maisie Fus and she will be happy to follow up on her

## 2012-10-01 NOTE — Telephone Encounter (Signed)
Pt will be happy to see Dr. Maisie Fus.  She will have to call back after she reviews her work schedule.

## 2012-10-01 NOTE — Telephone Encounter (Signed)
Left message on machine for patient to call back and ask for me. To make patient aware that Dr Maisie Fus will see her in follow up instead of Dr Jamey Ripa because of her specialty in colorectal care. Patient needs to schedule appt.

## 2012-10-02 ENCOUNTER — Other Ambulatory Visit (INDEPENDENT_AMBULATORY_CARE_PROVIDER_SITE_OTHER): Payer: Self-pay | Admitting: General Surgery

## 2012-10-02 ENCOUNTER — Telehealth (INDEPENDENT_AMBULATORY_CARE_PROVIDER_SITE_OTHER): Payer: Self-pay | Admitting: General Surgery

## 2012-10-02 MED ORDER — OXYCODONE-ACETAMINOPHEN 5-325 MG PO TABS: 1.0000 | ORAL_TABLET | ORAL | Status: DC | PRN

## 2012-10-02 NOTE — Telephone Encounter (Signed)
Called patient to let her know that RX for percocet 5/325 #30 no refills is at the front desk and patient will call the office tomorrow after she gets her work schd to set up an appt with now Dr. Maisie Fus

## 2012-10-02 NOTE — Telephone Encounter (Signed)
Pt called (from work) to ask for additional pain medicine.  She uses it only at home.  She has a daily 2 mile walk and is on her feet at work all day.  By the end of the day, is in a lot of pain that her ibuprofen does not help.  Please advise.  She can be reached on her cell ([phone]) or at work [phone].

## 2012-10-03 ENCOUNTER — Telehealth (INDEPENDENT_AMBULATORY_CARE_PROVIDER_SITE_OTHER): Payer: Self-pay | Admitting: General Surgery

## 2012-10-03 ENCOUNTER — Encounter (INDEPENDENT_AMBULATORY_CARE_PROVIDER_SITE_OTHER): Payer: Self-pay | Admitting: Surgery

## 2012-10-03 NOTE — Telephone Encounter (Signed)
error 

## 2012-10-09 ENCOUNTER — Encounter (INDEPENDENT_AMBULATORY_CARE_PROVIDER_SITE_OTHER): Payer: Self-pay | Admitting: General Surgery

## 2012-10-09 ENCOUNTER — Ambulatory Visit (INDEPENDENT_AMBULATORY_CARE_PROVIDER_SITE_OTHER): Payer: Self-pay | Admitting: General Surgery

## 2012-10-09 VITALS — BP 102/70 | HR 64 | Temp 98.5°F | Resp 14 | Ht 62.0 in | Wt 177.6 lb

## 2012-10-09 DIAGNOSIS — K5720 Diverticulitis of large intestine with perforation and abscess without bleeding: Secondary | ICD-10-CM

## 2012-10-09 DIAGNOSIS — K5732 Diverticulitis of large intestine without perforation or abscess without bleeding: Secondary | ICD-10-CM

## 2012-10-09 MED ORDER — OXYCODONE HCL 5 MG PO TABS: 5.0000 mg | ORAL_TABLET | ORAL | Status: DC | PRN

## 2012-10-09 NOTE — Progress Notes (Signed)
Heather [name] is a 42 y.o. female who is here for a follow up visit regarding her diverticular abscess.  She has a CT guided drain placed.  Her most recent drain study showed a communication with the colon.  She is here today because of increasing pain around drain site.  She denies nausea, vomiting, fevers or abd pain.  She is having regular BMs.    No past medical history   Past Surgical History  Procedure Date  . Cesarean section   . No past surgeries    Current outpatient prescriptions:clotrimazole (GYNE-LOTRIMIN) 1 % vaginal cream, Place 1 Applicatorful vaginally at bedtime., Disp: 45 g, Rfl: 0;  ibuprofen (ADVIL,MOTRIN) 600 MG tablet, Take 1 tablet (600 mg total) by mouth every 6 (six) hours as needed for fever., Disp: 30 tablet, Rfl: ;  OVER THE COUNTER MEDICATION, Take 1 tablet by mouth daily. [employer] brand of fiber supplement, Disp: , Rfl:  oxyCODONE (OXY IR/ROXICODONE) 5 MG immediate release tablet, Take 1 tablet (5 mg total) by mouth every 4 (four) hours as needed., Disp: 30 tablet, Rfl: 0;  oxyCODONE-acetaminophen (ROXICET) 5-325 MG per tablet, Take 1 tablet by mouth every 4 (four) hours as needed for pain., Disp: 30 tablet, Rfl: 0  No Known Allergies   ROS: no fevers or chills, no chest pain or SOB, no nausea, vomiting or changes in bowel habits.    Objective: Filed Vitals:   10/09/12 1629  BP: 102/70  Pulse: 64  Temp: 98.5 F (36.9 C)  Resp: 14    General appearance: alert and cooperative Resp: clear to auscultation bilaterally Cardio: regular rate and rhythm GI: soft, non-tender; bowel sounds normal; no masses,  no organomegaly  Drain site: clean, no erythema, suture intact  Assessment and Plan: We discussed the management of perforated diverticulitis.  Her drain looks fine.  I will see her back in 4 weeks after her next tube study.  If it is not closed by then, I will have IR pull the drain back some.  We discussed removal of her sigmoid colon in 3  months.    Vanita Panda, MD [facility], [place] [phone]

## 2012-10-09 NOTE — Patient Instructions (Signed)
Continue drain care.  I will see you back after your next drain study.

## 2012-10-11 ENCOUNTER — Telehealth (INDEPENDENT_AMBULATORY_CARE_PROVIDER_SITE_OTHER): Payer: Self-pay | Admitting: General Surgery

## 2012-10-11 NOTE — Telephone Encounter (Signed)
She called from work reporting increasing lower abdominal and pelvic pain that began after she ate popcorn on Thursday.  Not relieved well with Percocet or Advil.  No BM since Thursday which is atypical for her.  No fever or chills.  No n/v.  I instructed her to try some oral laxatives and dulcolax suppositories to see if having a BM would make her feel better.  If that does not help and the pain gets worse, I advised her to go to the ED.

## 2012-10-31 ENCOUNTER — Ambulatory Visit (HOSPITAL_COMMUNITY)
Admission: RE | Admit: 2012-10-31 | Discharge: 2012-10-31 | Disposition: A | Discharge status: Home / Self Care | Payer: Medicaid Other | Source: Ambulatory Visit | Attending: General Surgery | Admitting: General Surgery

## 2012-10-31 ENCOUNTER — Other Ambulatory Visit (INDEPENDENT_AMBULATORY_CARE_PROVIDER_SITE_OTHER): Payer: Self-pay | Admitting: General Surgery

## 2012-10-31 DIAGNOSIS — L0291 Cutaneous abscess, unspecified: Secondary | ICD-10-CM

## 2012-10-31 DIAGNOSIS — Z4682 Encounter for fitting and adjustment of non-vascular catheter: Secondary | ICD-10-CM | POA: Insufficient documentation

## 2012-10-31 MED ORDER — IOHEXOL 300 MG/ML  SOLN
50.0000 mL | Freq: Once | INTRAMUSCULAR | Status: AC | PRN
  Administered 2012-10-31: 20 mL

## 2012-10-31 NOTE — Procedures (Signed)
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  As a result, the tube was removed over a wire and a new 10 [place] drain was placed along the old tract but no longer adjacent to the bowel.  Plan to re-evaluate the drain in 1-2 weeks and hopefully remove the catheter at that time.

## 2012-11-14 ENCOUNTER — Encounter (INDEPENDENT_AMBULATORY_CARE_PROVIDER_SITE_OTHER): Payer: Self-pay | Admitting: General Surgery

## 2012-11-19 ENCOUNTER — Other Ambulatory Visit (HOSPITAL_COMMUNITY): Payer: Self-pay | Admitting: Interventional Radiology

## 2012-11-19 ENCOUNTER — Telehealth (HOSPITAL_COMMUNITY): Payer: Self-pay

## 2012-11-20 ENCOUNTER — Telehealth (INDEPENDENT_AMBULATORY_CARE_PROVIDER_SITE_OTHER): Payer: Self-pay | Admitting: General Surgery

## 2012-11-20 NOTE — Telephone Encounter (Signed)
Pt walked in to front desk at [facility], carrying her drainage bag.  Pt was advised to go to ER to get tube replaced (IR.)  Pt does not want the drainage tube back in.  Paged and updated Dr. Maisie Fus; said OK to leave it out for now.  Instructed her to call for any fever or abdomenal pain.  Instructed nurse to set up for CT in about a week to reassess.

## 2012-11-21 ENCOUNTER — Encounter (INDEPENDENT_AMBULATORY_CARE_PROVIDER_SITE_OTHER): Payer: Self-pay | Admitting: General Surgery

## 2012-11-21 ENCOUNTER — Ambulatory Visit (INDEPENDENT_AMBULATORY_CARE_PROVIDER_SITE_OTHER): Payer: Self-pay | Admitting: General Surgery

## 2012-11-21 ENCOUNTER — Encounter (INDEPENDENT_AMBULATORY_CARE_PROVIDER_SITE_OTHER): Payer: Self-pay

## 2012-11-21 VITALS — BP 110/68 | HR 80 | Temp 96.8°F | Resp 18 | Ht 62.0 in | Wt 168.6 lb

## 2012-11-21 DIAGNOSIS — Z8719 Personal history of other diseases of the digestive system: Secondary | ICD-10-CM

## 2012-11-21 NOTE — Patient Instructions (Addendum)
Your Ct is Monday 3/17 at [facility] 1st Floor Radiology.  Arrive at 0945.  Have no solid foods 4 hours prior.  Drink your contrast at 8am and 9am.  We will schedule your surgery after that.  Call the office if you have fevers or worsening abd pain

## 2012-11-21 NOTE — Progress Notes (Signed)
Heather [name] is a 42 y.o. female who is here for a follow up visit regarding diverticular abscess.  Her drain fell out a couple days ago.  There wasn't anything coming out of the bag prior to this.  She denies any drainage from the wound.  She denies fevers or abd pain.  Objective: Filed Vitals:   11/21/12 1405  BP: 110/68  Pulse: 80  Temp: 96.8 F (36 C)  Resp: 18   General appearance: alert and cooperative GI: normal findings: soft, non-tender  Assessment and Plan: Heather [name] is a 42 y.o. F who is s/p diverticulitis with abscess in Dec.  She has done well with the CT drain.  I will repeat her CT on Mon.  If there is a fluid collection, we will replace the drain.  I suspect the area has healed.  I discussed with her the need for sigmoidectomy.  We talked about the anatomy and physiology.  We discussed the risks of surgery, which are bleeding, infection, damage to adjacent structures, including ureters, bladder and reproductive organs, leak (5-7%), as well as the need for ostomy and complications related to major surgery in general like DVT, pneumonia, MI and death.  I believe she understands the risks of surgery as well as the risk of not doing the surgery and has agreed to proceed.     Vanita Panda, MD [facility], [place] [phone]

## 2012-11-24 ENCOUNTER — Ambulatory Visit (HOSPITAL_COMMUNITY)
Admission: RE | Admit: 2012-11-24 | Discharge: 2012-11-24 | Disposition: A | Discharge status: Home / Self Care | Payer: Self-pay | Source: Ambulatory Visit | Attending: General Surgery | Admitting: General Surgery

## 2012-11-24 DIAGNOSIS — K5730 Diverticulosis of large intestine without perforation or abscess without bleeding: Secondary | ICD-10-CM | POA: Insufficient documentation

## 2012-11-24 DIAGNOSIS — Z8719 Personal history of other diseases of the digestive system: Secondary | ICD-10-CM

## 2012-11-24 MED ORDER — IOHEXOL 300 MG/ML  SOLN
100.0000 mL | Freq: Once | INTRAMUSCULAR | Status: AC | PRN
  Administered 2012-11-24: 100 mL via INTRAVENOUS

## 2012-11-25 ENCOUNTER — Other Ambulatory Visit (INDEPENDENT_AMBULATORY_CARE_PROVIDER_SITE_OTHER): Payer: Self-pay | Admitting: General Surgery

## 2012-11-25 ENCOUNTER — Telehealth (INDEPENDENT_AMBULATORY_CARE_PROVIDER_SITE_OTHER): Payer: Self-pay

## 2012-11-25 ENCOUNTER — Encounter (INDEPENDENT_AMBULATORY_CARE_PROVIDER_SITE_OTHER): Payer: Self-pay | Admitting: General Surgery

## 2012-11-25 DIAGNOSIS — Z01818 Encounter for other preprocedural examination: Secondary | ICD-10-CM

## 2012-11-25 MED ORDER — METRONIDAZOLE 500 MG PO TABS: 500.0000 mg | ORAL_TABLET | ORAL | Status: DC

## 2012-11-25 NOTE — Telephone Encounter (Signed)
Message copied by Ivory Broad on Tue Nov 25, 2012  9:00 AM ------      Message from: Ider, Broadus John.      Created: Mon Nov 24, 2012  1:03 PM      Regarding: CT results       Her CT looks fine.  We can go ahead and schedule her surgery, but I need Urology to place bilateral stents in the OR before I start.  Do you know how to get that set up?            AT      ----- Message -----         From: Rad Results In Interface         Sent: 11/24/2012  11:20 AM           To: Romie Levee, MD                   ------

## 2012-11-25 NOTE — Telephone Encounter (Signed)
I got the pt on the phone and Dr Maisie Fus talked to her.  She discussed the ct with her and told her she needs a colonoscopy before surgery.  She will plan it the day before her surgery.  She will do one prep.  Dr Maisie Fus will do the colonoscopy.

## 2012-11-26 ENCOUNTER — Encounter (INDEPENDENT_AMBULATORY_CARE_PROVIDER_SITE_OTHER): Payer: Self-pay | Admitting: General Surgery

## 2012-12-03 ENCOUNTER — Telehealth (INDEPENDENT_AMBULATORY_CARE_PROVIDER_SITE_OTHER): Payer: Self-pay

## 2012-12-03 NOTE — Telephone Encounter (Signed)
I called the pt and let her know the antibiotics were sent in to be used the day before surgery for her bowel prep.  She said she has been calling to get scheduled for surgery and hasn't heard back.  I transferred her call and had a scheduler pick up.

## 2012-12-03 NOTE — Telephone Encounter (Signed)
Message copied by Ivory Broad on Wed Dec 03, 2012  9:59 AM ------      Message from: Heather [name]      Created: Wed Dec 03, 2012  9:31 AM       Pt states that Dr Maisie Fus has called in medication at [facility] and she doesn't know what the medicine is suppose to be for.can you give her a call please? [phone] ------

## 2013-01-09 ENCOUNTER — Encounter (HOSPITAL_COMMUNITY): Payer: Self-pay | Admitting: *Deleted

## 2013-01-09 ENCOUNTER — Emergency Department (HOSPITAL_COMMUNITY)
Admission: EM | Admit: 2013-01-09 | Discharge: 2013-01-09 | Disposition: A | Discharge status: Home / Self Care | Payer: Medicaid Other | Attending: Emergency Medicine | Admitting: Emergency Medicine

## 2013-01-09 ENCOUNTER — Telehealth (INDEPENDENT_AMBULATORY_CARE_PROVIDER_SITE_OTHER): Payer: Self-pay

## 2013-01-09 ENCOUNTER — Emergency Department (HOSPITAL_COMMUNITY): Payer: Medicaid Other

## 2013-01-09 DIAGNOSIS — R110 Nausea: Secondary | ICD-10-CM | POA: Insufficient documentation

## 2013-01-09 DIAGNOSIS — Z3202 Encounter for pregnancy test, result negative: Secondary | ICD-10-CM | POA: Insufficient documentation

## 2013-01-09 DIAGNOSIS — K5900 Constipation, unspecified: Secondary | ICD-10-CM | POA: Insufficient documentation

## 2013-01-09 DIAGNOSIS — Z8719 Personal history of other diseases of the digestive system: Secondary | ICD-10-CM | POA: Insufficient documentation

## 2013-01-09 DIAGNOSIS — R1032 Left lower quadrant pain: Secondary | ICD-10-CM | POA: Insufficient documentation

## 2013-01-09 DIAGNOSIS — R52 Pain, unspecified: Secondary | ICD-10-CM | POA: Insufficient documentation

## 2013-01-09 DIAGNOSIS — Z9889 Other specified postprocedural states: Secondary | ICD-10-CM | POA: Insufficient documentation

## 2013-01-09 HISTORY — DX: Diverticulitis of intestine, part unspecified, without perforation or abscess without bleeding: K57.92

## 2013-01-09 LAB — CBC WITH DIFFERENTIAL/PLATELET
Eosinophils Relative: 1 % (ref 0–5)
Lymphocytes Relative: 23 % (ref 12–46)
Lymphs Abs: 2.3 10*3/uL (ref 0.7–4.0)
MCV: 83 fL (ref 78.0–100.0)
Platelets: 219 10*3/uL (ref 150–400)
RBC: 4.18 MIL/uL (ref 3.87–5.11)
WBC: 9.8 10*3/uL (ref 4.0–10.5)

## 2013-01-09 LAB — COMPREHENSIVE METABOLIC PANEL
ALT: 9 U/L (ref 0–35)
Alkaline Phosphatase: 70 U/L (ref 39–117)
CO2: 24 mEq/L (ref 19–32)
Calcium: 8.9 mg/dL (ref 8.4–10.5)
Chloride: 105 mEq/L (ref 96–112)
GFR calc Af Amer: >90 mL/min (ref >90)
GFR calc non Af Amer: >90 mL/min (ref >90)
Glucose, Bld: 92 mg/dL (ref 70–99)
Potassium: 3.6 mEq/L (ref 3.5–5.1)
Sodium: 138 mEq/L (ref 135–145)
Total Bilirubin: 0.5 mg/dL (ref 0.3–1.2)

## 2013-01-09 LAB — URINALYSIS, MICROSCOPIC ONLY
Bilirubin Urine: NEGATIVE
Hgb urine dipstick: NEGATIVE
Protein, ur: NEGATIVE mg/dL
Urobilinogen, UA: 0.2 mg/dL (ref 0.0–1.0)

## 2013-01-09 LAB — POCT PREGNANCY, URINE: Preg Test, Ur: NEGATIVE

## 2013-01-09 MED ORDER — FENTANYL CITRATE 0.05 MG/ML IJ SOLN
50.0000 ug | Freq: Once | INTRAMUSCULAR | Status: AC
  Administered 2013-01-09: 50 ug via INTRAVENOUS
  Filled 2013-01-09: qty 2

## 2013-01-09 MED ORDER — ONDANSETRON 4 MG PO TBDP
8.0000 mg | ORAL_TABLET | Freq: Once | ORAL | Status: AC
  Administered 2013-01-09: 8 mg via ORAL
  Filled 2013-01-09: qty 2

## 2013-01-09 MED ORDER — POLYETHYLENE GLYCOL 3350 17 GM/SCOOP PO POWD: 17.0000 g | Freq: Every day | ORAL | Status: DC

## 2013-01-09 NOTE — ED Notes (Signed)
Pt states she was diagnosed with Diverticulitis and was supposed to have surgery soon. Pt c/o abd pain, and constipation. Pt rates pain 7/10.

## 2013-01-09 NOTE — ED Provider Notes (Signed)
Heather [name] is a 42 y.o. female who complains of persistent abdominal pain for 2 weeks. She is worried that her diverticulitis has come back. She has been constipated. She had a drainage tube right-sided for diverticulitis out about 2 weeks ago. She denies problems with the site of the drain. She has been constipated.  Exam alert, calm, cooperative. Vital signs normal. Abdomen soft, nontender, no mass. No Guarding  Dg Abd Acute W/chest  01/09/2013  *RADIOLOGY REPORT*  Clinical Data: Abdominal pain  ACUTE ABDOMEN SERIES (ABDOMEN 2 VIEW & CHEST 1 VIEW)  Comparison: CT 11/24/2012  Findings: Cardiomediastinal silhouette is within normal limits. The lungs are clear. No pleural effusion.  No pneumothorax.  No acute osseous abnormality.  No free air beneath the diaphragms.  Normal bowel gas pattern.  No abnormal calcific opacity or acute osseous finding. Right pelvic phlebolith again noted.  IMPRESSION: Normal bowel gas pattern.  No acute cardiopulmonary process.   Original Report Authenticated By: Christiana Pellant, M.D.    Results for orders placed during the hospital encounter of 01/09/13  CBC WITH DIFFERENTIAL      Result Value Range   WBC 9.8  4.0 - 10.5 K/uL   RBC 4.18  3.87 - 5.11 MIL/uL   Hemoglobin 12.6  12.0 - 15.0 g/dL   HCT 16.1 (*) 09.6 - 04.5 %   MCV 83.0  78.0 - 100.0 fL   MCH 30.1  26.0 - 34.0 pg   MCHC 36.3 (*) 30.0 - 36.0 g/dL   RDW 40.9  81.1 - 91.4 %   Platelets 219  150 - 400 K/uL   Neutrophils Relative 66  43 - 77 %   Neutro Abs 6.4  1.7 - 7.7 K/uL   Lymphocytes Relative 23  12 - 46 %   Lymphs Abs 2.3  0.7 - 4.0 K/uL   Monocytes Relative 9  3 - 12 %   Monocytes Absolute 0.9  0.1 - 1.0 K/uL   Eosinophils Relative 1  0 - 5 %   Eosinophils Absolute 0.1  0.0 - 0.7 K/uL   Basophils Relative 0  0 - 1 %   Basophils Absolute 0.0  0.0 - 0.1 K/uL  COMPREHENSIVE METABOLIC PANEL      Result Value Range   Sodium 138  135 - 145 mEq/L   Potassium 3.6  3.5 - 5.1 mEq/L   Chloride 105  96 -  112 mEq/L   CO2 24  19 - 32 mEq/L   Glucose, Bld 92  70 - 99 mg/dL   BUN 10  6 - 23 mg/dL   Creatinine, Ser 7.82  0.50 - 1.10 mg/dL   Calcium 8.9  8.4 - 95.6 mg/dL   Total Protein 6.7  6.0 - 8.3 g/dL   Albumin 3.4 (*) 3.5 - 5.2 g/dL   AST 15  0 - 37 U/L   ALT 9  0 - 35 U/L   Alkaline Phosphatase 70  39 - 117 U/L   Total Bilirubin 0.5  0.3 - 1.2 mg/dL   GFR calc non Af Amer >90  >90 mL/min   GFR calc Af Amer >90  >90 mL/min  LIPASE, BLOOD      Result Value Range   Lipase 11  11 - 59 U/L  URINALYSIS, MICROSCOPIC ONLY      Result Value Range   Color, Urine YELLOW  YELLOW   APPearance HAZY (*) CLEAR   Specific Gravity, Urine 1.014  1.005 - 1.030   pH 6.0  5.0 - 8.0   Glucose, UA NEGATIVE  NEGATIVE mg/dL   Hgb urine dipstick NEGATIVE  NEGATIVE   Bilirubin Urine NEGATIVE  NEGATIVE   Ketones, ur NEGATIVE  NEGATIVE mg/dL   Protein, ur NEGATIVE  NEGATIVE mg/dL   Urobilinogen, UA 0.2  0.0 - 1.0 mg/dL   Nitrite NEGATIVE  NEGATIVE   Leukocytes, UA NEGATIVE  NEGATIVE   WBC, UA 0-2  <3 WBC/hpf   Bacteria, UA MANY (*) RARE   Squamous Epithelial / LPF MANY (*) RARE  POCT PREGNANCY, URINE      Result Value Range   Preg Test, Ur NEGATIVE  NEGATIVE     Assessment:  Nonspecific abdominal pain with constipation. Doubt diverticulitis, colitis, bowel obstruction. Doubt metabolic instability, serious bacterial infection or impending vascular collapse; the patient is stable for discharge.  Consultation: 13:05- Gen. Surg. They recommended when necessary followup with her primary surgeon   Plan: Home Medications- MiraLax; Home Treatments- rest; Recommended follow up- Dr. Maisie Fus when necessary  Medical screening examination/treatment/procedure(s) were conducted as a shared visit with non-physician practitioner(s) and myself.  I personally evaluated the patient during the encounter  Flint Melter, MD 01/09/13 2205

## 2013-01-09 NOTE — ED Provider Notes (Signed)
History     CSN: [id number]  Arrival date & time 01/09/13  4098   First MD Initiated Contact with Patient 01/09/13 1004      Chief Complaint  Patient presents with  . Abdominal Pain    (Consider location/radiation/quality/duration/timing/severity/associated sxs/prior treatment) HPI Comments: 42 y.o. Female with PMHx of diverticulitis with abscess and drainage tube, perforated bowel, presents with increasing LLQ abdominal pain over the past week. Pt reports stressors such as her brother dying and knows she has not been eating like she should. Reports pain will escalate to 10/10 when she moves, feels sharp. Will decrease to 5/10 dull ache when she lies still. Pt has taken her prescribed Percocet with some relief, but over the last 3-4 days notices she is having difficulty with bowel movements and the straining is causing additional pain.   Pt admits nausea and mild constipation. Denies vomiting, diarrhea, dysuria, hematochezia.   Pt is seen by Dr. Romie Levee [facility]) for her diverticulitis. She has called the office over the past few days. They have not seen her the pt believes due to financial issues (she has recently applied to Medicaid) and told her to come to the ED for eval.    Patient is a 42 y.o. female presenting with abdominal pain.  Abdominal Pain Associated symptoms: constipation and nausea   Associated symptoms: no chest pain, no diarrhea, no dysuria, no fever, no shortness of breath and no vomiting     Past Medical History  Diagnosis Date  . Diverticulitis     Past Surgical History  Procedure Laterality Date  . Cesarean section    . No past surgeries    . Abdominal surgery      Family History  Problem Relation Age of Onset  . Skin cancer Sister   . Breast cancer Sister     History  Substance Use Topics  . Smoking status: Never Smoker   . Smokeless tobacco: Not on file  . Alcohol Use: Yes    OB History   Grav Para Term Preterm  Abortions TAB SAB Ect Mult Living                  Review of Systems  Constitutional: Negative for fever and diaphoresis.  HENT: Negative for neck pain and neck stiffness.   Eyes: Negative for visual disturbance.  Respiratory: Negative for apnea, chest tightness and shortness of breath.   Cardiovascular: Negative for chest pain and palpitations.  Gastrointestinal: Positive for nausea, abdominal pain and constipation. Negative for vomiting and diarrhea.       LLQ  Genitourinary: Negative for dysuria.  Musculoskeletal: Negative for gait problem.  Skin:       Drainage tube that has fallen out right gluteal area.  Neurological: Negative for dizziness, weakness, light-headedness, numbness and headaches.    Allergies  Review of patient's allergies indicates no known allergies.  Home Medications   Current Outpatient Rx  Name  Route  Sig  Dispense  Refill  . docusate sodium (COLACE) 100 MG capsule   Oral   Take 300 mg by mouth daily.         Marland Kitchen OVER THE COUNTER MEDICATION   Oral   Take 1 tablet by mouth daily. [employer] brand of fiber supplement         . oxyCODONE (OXY IR/ROXICODONE) 5 MG immediate release tablet   Oral   Take 1 tablet (5 mg total) by mouth every 4 (four) hours as needed.   30  tablet   0     BP 105/71  Pulse 98  Temp(Src) 97.9 F (36.6 C) (Oral)  Resp 16  SpO2 96%  LMP 12/10/2012  Physical Exam  Nursing note and vitals reviewed. Constitutional: She is oriented to person, place, and time. She appears well-developed and well-nourished. No distress.  uncomfortable  HENT:  Head: Normocephalic and atraumatic.  Eyes: Conjunctivae and EOM are normal.  Neck: Normal range of motion. Neck supple.  No meningeal signs  Cardiovascular: Normal rate, regular rhythm and normal heart sounds.  Exam reveals no gallop and no friction rub.   No murmur heard. Pulmonary/Chest: Effort normal and breath sounds normal. No respiratory distress. She has no wheezes. She  has no rales. She exhibits no tenderness.  Abdominal: Soft. Normal appearance and bowel sounds are normal. She exhibits no distension. There is tenderness. There is no rigidity, no rebound, no guarding, no CVA tenderness and negative Murphy's sign.    LLQ  Musculoskeletal: Normal range of motion. She exhibits no edema and no tenderness.  Neurological: She is alert and oriented to person, place, and time. No cranial nerve deficit.  Skin: Skin is warm and dry. She is not diaphoretic. No erythema.  Right gluteal area, noted where drainage tube fell out, skin intact, no pus, no bleeding  Psychiatric:  anxious    ED Course  Procedures (including critical care time)  Labs Reviewed  CBC WITH DIFFERENTIAL - Abnormal; Notable for the following:    HCT 34.7 (*)    MCHC 36.3 (*)    All other components within normal limits  COMPREHENSIVE METABOLIC PANEL - Abnormal; Notable for the following:    Albumin 3.4 (*)    All other components within normal limits  LIPASE, BLOOD  URINALYSIS, MICROSCOPIC ONLY  POCT PREGNANCY, URINE   Dg Abd Acute W/chest  01/09/2013  *RADIOLOGY REPORT*  Clinical Data: Abdominal pain  ACUTE ABDOMEN SERIES (ABDOMEN 2 VIEW & CHEST 1 VIEW)  Comparison: CT 11/24/2012  Findings: Cardiomediastinal silhouette is within normal limits. The lungs are clear. No pleural effusion.  No pneumothorax.  No acute osseous abnormality.  No free air beneath the diaphragms.  Normal bowel gas pattern.  No abnormal calcific opacity or acute osseous finding. Right pelvic phlebolith again noted.  IMPRESSION: Normal bowel gas pattern.  No acute cardiopulmonary process.   Original Report Authenticated By: Christiana Pellant, M.D.    Medications  fentaNYL (SUBLIMAZE) injection 50 mcg (50 mcg Intravenous Given 01/09/13 1115)  ondansetron (ZOFRAN-ODT) disintegrating tablet 8 mg (8 mg Oral Given 01/09/13 1115)    Discharge Medication List as of 01/09/2013  1:30 PM    START taking these medications   Details    polyethylene glycol powder (GLYCOLAX/MIRALAX) powder Take 17 g by mouth daily. Until daily soft stools  OTC, Starting 01/09/2013, Until Discontinued, Print         1. Abdominal pain, acute, left lower quadrant       MDM  Pt with hx of diverticulitis, abscess, and bowel perf who has multiple CT scans. Pt is uncomfortable, but not in acute distress, nontoxic, nonseptic appearing.  Patient's pain and other symptoms adequately managed in emergency department.  Patient does not meet the SIRS or Sepsis criteria.  Will review labs, imaging, and assess pain and re-evaluate.  On repeat exam patient does not have a surgical abdomen and there are nor peritoneal signs.  Pain has been controlled from 10/10 to 4/10. No indication of appendicitis, bowel obstruction, bowel perforation, cholecystitis, PID or  ectopic pregnancy.    Discussed pt case with Dr. Effie Shy who will follow up with surgery for care plan given that labwork and acute abdominal series is reassuring thus far. After discussion with Dr. Maisie Fus, will d/c pt with return precautions, Miralax and follow up with Dr. Maisie Fus in 7-14 days.           Glade Nurse, PA-C 01/09/13 1557

## 2013-01-09 NOTE — ED Notes (Signed)
Discharge and follow up instructions reviewed. Pt verbalized understanding.  

## 2013-01-09 NOTE — Telephone Encounter (Signed)
Message copied by Ivory Broad on Fri Jan 09, 2013  2:48 PM ------      Message from: Justin, Broadus John.      Created: Fri Jan 09, 2013  1:42 PM      Regarding: RE: Dr. Maisie Fus      Contact: [phone]       We need to wait until her pain is resolved.  Make sure she is taking a fiber supplement and stool softeners.  I can see her in the office if she is having pain and discuss surgery then            AT      ----- Message -----         From: Ivory Broad, RN         Sent: 01/08/2013   9:21 AM           To: Romie Levee, MD      Subject: Annell Greening: Dr. Maisie Fus                                             Are you ready to schedule her?      ----- Message -----         From: Brigitt Heger         Sent: 01/08/2013   9:17 AM           To: Ivory Broad, RN      Subject: Dr. Maisie Fus                                               Pt called and would like to go through with sx.  She just got approved for insurance.  She is in a lot of pain, and has major problems with her bms.  She is wondering what she needs to do,  see Dr. Maisie Fus or go through more exams before sx? Thx             ------

## 2013-01-09 NOTE — ED Notes (Signed)
Pt transported to xray 

## 2013-01-09 NOTE — Telephone Encounter (Signed)
I called and left a message for the pt to call so I can give her the message below.

## 2013-01-09 NOTE — ED Notes (Signed)
Pt reports hx of diverticulitis and abd surgery. Again having pain to entire abd and n/v.

## 2013-01-13 NOTE — Telephone Encounter (Signed)
I called the pt again.  She has been given a rx for Metamucil and she is doing fiber supplements.  She feels some better and more comfortable.  She wants to come in and see Dr Maisie Fus to discuss surgery asap.  I gave her an appointment for tomorrow.

## 2013-01-14 ENCOUNTER — Encounter (INDEPENDENT_AMBULATORY_CARE_PROVIDER_SITE_OTHER): Payer: Self-pay | Admitting: General Surgery

## 2013-01-14 ENCOUNTER — Ambulatory Visit (INDEPENDENT_AMBULATORY_CARE_PROVIDER_SITE_OTHER): Payer: Self-pay | Admitting: General Surgery

## 2013-01-14 VITALS — BP 118/70 | HR 72 | Resp 16 | Ht 62.0 in | Wt 167.6 lb

## 2013-01-14 DIAGNOSIS — K5730 Diverticulosis of large intestine without perforation or abscess without bleeding: Secondary | ICD-10-CM

## 2013-01-14 DIAGNOSIS — K5790 Diverticulosis of intestine, part unspecified, without perforation or abscess without bleeding: Secondary | ICD-10-CM

## 2013-01-14 NOTE — Patient Instructions (Addendum)
[facility]  ONE-DAY (1) PRE-OP HOME COLON PREP INSTRUCTIONS: ** MIRALAX / GATORADE PREP **  You must follow the instructions below carefully.  If you have questions or problems, please call and speak to someone in the clinic department at our office:   [phone].     INSTRUCTIONS: 1. Five days prior to your procedure do not eat nuts, popcorn, or fruit with seeds.  Stop all fiber supplements such as Metamucil, Citrucel, etc. 2. Two days before surgery fill the prescription at a pharmacy of your choice and purchase the additional supplies below.         MIRALAX - GATORADE -- DULCOLAX TABS:   Purchase a bottle of MIRALAX  (255 gm bottle)    In addition, purchase four (4) DULCOLAX TABLETS (no prescription required- ask the pharmacist if you can't find them)    Purchase one 64 oz GATORADE.  (Do NOT purchase red Gatorade; any other flavor is acceptable) and place in refrigerator to get cold.  3.   Day Before Surgery:   6 am: take the 4 Dulcolax tablets   You may only have clear liquids (tea, coffee, juice, broth, jello, soft drinks, gummy bears).  You cannot have solid foods, cream, milk or milk products.  Drink at lease 8 ounces of liquids every hour while awake.   Mix the entire bottle of MiraLax and the Gatorade in a large container.    10:00am: Begin drinking the Gatorade mixture until gone (8 oz every 15-30 minutes).      You may suck on a lime wedge or hard candy to "freshen your palate" in between glasses   If you are a diabetic, take your blood sugar reading several time throughout the prep.  Have some juice available to take if your sugar level gets too low   You may feel chilled while taking the prep.  Have some warm tea or broth to help warm up.   Continue clear liquids until midnight or bedtime  3. The day of your procedure:   Do not eat or drink ANYTHING after midnight before your surgery.     If you take Heart or Blood Pressure medicine, ask the pre-op nurses about  these during your preop appointment.   Further pre-operative instructions will be given to you from the hospital.   Expect to be contacted 5-7 days before your surgery.  

## 2013-01-14 NOTE — Progress Notes (Signed)
Valkyrie Guardiola is a 42 y.o. female who is here for a follow up visit regarding her perforated diverticulitis.  This was in Dec.  She had a drain for a few weeks, but that came out, and she has done pretty well with that.  She is still having some cramping with BM's.  She has a minimal appetite.  She was recently in the ED and all labs and imaging were normal.    Objective: Filed Vitals:   01/14/13 1359  BP: 118/70  Pulse: 72  Resp: 16    General appearance: alert and cooperative GI: abnormal findings:  tendernesss suprapubically.   Assessment and Plan: Stephaniemarie Stoffel is a 77 y.o. F with a h/o perforated diverticulitis.  She is ready to schedule her surgery.  We will perform a colonoscopy the day prior to the procedure.  I have asked the urologists to place ureter stents to assist [place] with dissection in the OR. The surgery and anatomy were described to the patient as well as the risks of surgery and the possible complications.  These include: Bleeding, infection and possible wound complications such as hernia, damage to adjacent structures, leak of surgical connections, which can lead to other surgeries and possibly an ostomy (5-7%), possible need for other procedures, such as abscess drains in radiology, possible prolonged hospital stay, possible diarrhea from removal of part of the colon, possible constipation from narcotics, prolonged fatigue/weakness or appetite loss, possible complications of their medical problems such as heart disease or arrhythmias or lung problems, death (less than 1%).  I believe the patient understands and wishes to proceed with the surgery.     Vanita Panda, MD [facility], [place] [phone]

## 2013-06-02 ENCOUNTER — Telehealth (INDEPENDENT_AMBULATORY_CARE_PROVIDER_SITE_OTHER): Payer: Self-pay

## 2013-06-02 NOTE — Telephone Encounter (Signed)
Patient calling into office to report that she's had a colonoscopy and is ready to proceed with scheduling surgery for Diverticulitis.  Patient states that there was a problem with her insurance and now she thinks that she's approved.  Patient would like to more forward with scheduling surgery.  Please advise.

## 2013-06-03 NOTE — Telephone Encounter (Signed)
Have her schedule an apt, and we'll check her out and get her set up for surgery.

## 2013-06-04 NOTE — Telephone Encounter (Signed)
Left message for pt to call so I can  let her know to schedule an appointment.

## 2013-06-09 ENCOUNTER — Encounter (HOSPITAL_COMMUNITY): Payer: Self-pay | Admitting: Emergency Medicine

## 2013-06-09 ENCOUNTER — Emergency Department (INDEPENDENT_AMBULATORY_CARE_PROVIDER_SITE_OTHER)
Admission: EM | Admit: 2013-06-09 | Discharge: 2013-06-09 | Disposition: A | Discharge status: Home / Self Care | Payer: Medicaid Other | Source: Home / Self Care | Attending: Family Medicine | Admitting: Family Medicine

## 2013-06-09 DIAGNOSIS — K5792 Diverticulitis of intestine, part unspecified, without perforation or abscess without bleeding: Secondary | ICD-10-CM

## 2013-06-09 DIAGNOSIS — K5732 Diverticulitis of large intestine without perforation or abscess without bleeding: Secondary | ICD-10-CM

## 2013-06-09 LAB — POCT URINALYSIS DIP (DEVICE)
Bilirubin Urine: NEGATIVE
Glucose, UA: NEGATIVE mg/dL
Hgb urine dipstick: NEGATIVE
Nitrite: NEGATIVE
Specific Gravity, Urine: 1.02 (ref 1.005–1.030)

## 2013-06-09 LAB — POCT PREGNANCY, URINE: Preg Test, Ur: NEGATIVE

## 2013-06-09 MED ORDER — METRONIDAZOLE 500 MG PO TABS: 500.0000 mg | ORAL_TABLET | Freq: Three times a day (TID) | ORAL | Status: DC

## 2013-06-09 MED ORDER — CIPROFLOXACIN HCL 500 MG PO TABS: 500.0000 mg | ORAL_TABLET | Freq: Two times a day (BID) | ORAL | Status: DC

## 2013-06-09 NOTE — ED Notes (Signed)
C/o lower abdominal pain since Wednesday. Sharp/ intermittent. Hx of diverticulitis. Pt states that she was to have a second surgery but was unable to due to death in family and illness of mother.  Pt denies urinary symptoms. Fever, n/v/d.   States the pain feels the same when she found out she had diverticulitis las dec.  Pt is alert and oriented no signs of distress.

## 2013-06-09 NOTE — ED Provider Notes (Signed)
Heather [name] is a 42 y.o. female who presents to Urgent Care today for lower left quadrant abdominal pain worsening over the past 6 days. Patient has a history of diverticulitis. Her pain is consistent with early symptoms of diverticulitis. She is able to eat and drink and had normal bowel movements. She denies any significant fevers or chills nausea vomiting or diarrhea. She has not tried any medications yet. She feels well otherwise. She is an appointment with a general surgeon in about 10 days.    Past Medical History  Diagnosis Date  . Diverticulitis    History  Substance Use Topics  . Smoking status: Never Smoker   . Smokeless tobacco: Not on file  . Alcohol Use: Yes   ROS as above Medications reviewed. No current facility-administered medications for this encounter.   Current Outpatient Prescriptions  Medication Sig Dispense Refill  . ciprofloxacin (CIPRO) 500 MG tablet Take 1 tablet (500 mg total) by mouth every 12 (twelve) hours.  28 tablet  0  . docusate sodium (COLACE) 100 MG capsule Take 300 mg by mouth daily.      . metroNIDAZOLE (FLAGYL) 500 MG tablet Take 1 tablet (500 mg total) by mouth 3 (three) times daily.  42 tablet  0  . OVER THE COUNTER MEDICATION Take 1 tablet by mouth daily. [employer] brand of fiber supplement      . polyethylene glycol powder (GLYCOLAX/MIRALAX) powder Take 17 g by mouth daily. Until daily soft stools  OTC  255 g  0    Exam:  BP 95/65  Pulse 82  Temp(Src) 98.3 F (36.8 C) (Oral)  Resp 12  SpO2 97%  LMP 05/26/2013 Gen: Well NAD, nontoxic appearing HEENT: EOMI,  MMM Lungs: CTABL Nl WOB Heart: RRR no MRG Abd: NABS, nondistended. Mildly tender to palpation left lower quadrant. No rebound or guarding. Exts: Non edematous BL  LE, warm and well perfused.   Results for orders placed during the hospital encounter of 06/09/13 (from the past 24 hour(s))  POCT URINALYSIS DIP (DEVICE)     Status: None   Collection Time    06/09/13 11:12 AM   Result Value Range   Glucose, UA NEGATIVE  NEGATIVE mg/dL   Bilirubin Urine NEGATIVE  NEGATIVE   Ketones, ur NEGATIVE  NEGATIVE mg/dL   Specific Gravity, Urine 1.020  1.005 - 1.030   Hgb urine dipstick NEGATIVE  NEGATIVE   pH 5.5  5.0 - 8.0   Protein, ur NEGATIVE  NEGATIVE mg/dL   Urobilinogen, UA 0.2  0.0 - 1.0 mg/dL   Nitrite NEGATIVE  NEGATIVE   Leukocytes, UA NEGATIVE  NEGATIVE  POCT PREGNANCY, URINE     Status: None   Collection Time    06/09/13 11:19 AM      Result Value Range   Preg Test, Ur NEGATIVE  NEGATIVE   No results found.  Assessment and Plan: 42 y.o. female with mild acute diverticulitis. Plan for outpatient management as patient does not have a fever and her pain is not severe and she does not display peritoneal abdominal signs.  Plan: Clear liquid diet, Cipro and Flagyl, close followup if not improving or worsening in the emergency room. Followup with Gen. surgery or gastroenterology for further evaluation this issue. Patient may require a colonoscopy or further evaluation. She expresses understanding and agreement.     Rodolph Bong, MD 06/09/13 1131

## 2013-09-15 ENCOUNTER — Emergency Department (HOSPITAL_COMMUNITY): Payer: Medicaid Other

## 2013-09-15 ENCOUNTER — Encounter (HOSPITAL_COMMUNITY): Payer: Self-pay | Admitting: Emergency Medicine

## 2013-09-15 ENCOUNTER — Inpatient Hospital Stay (HOSPITAL_COMMUNITY)
Admission: EM | Admit: 2013-09-15 | Discharge: 2013-09-19 | DRG: 391 | Disposition: A | Discharge status: Home / Self Care | Payer: Medicaid Other | Attending: General Surgery | Admitting: General Surgery

## 2013-09-15 DIAGNOSIS — K5732 Diverticulitis of large intestine without perforation or abscess without bleeding: Principal | ICD-10-CM

## 2013-09-15 DIAGNOSIS — F329 Major depressive disorder, single episode, unspecified: Secondary | ICD-10-CM | POA: Diagnosis present

## 2013-09-15 DIAGNOSIS — Z79899 Other long term (current) drug therapy: Secondary | ICD-10-CM

## 2013-09-15 DIAGNOSIS — K5792 Diverticulitis of intestine, part unspecified, without perforation or abscess without bleeding: Secondary | ICD-10-CM

## 2013-09-15 DIAGNOSIS — F3289 Other specified depressive episodes: Secondary | ICD-10-CM | POA: Diagnosis present

## 2013-09-15 DIAGNOSIS — K631 Perforation of intestine (nontraumatic): Secondary | ICD-10-CM

## 2013-09-15 LAB — URINALYSIS, ROUTINE W REFLEX MICROSCOPIC
Bilirubin Urine: NEGATIVE
GLUCOSE, UA: NEGATIVE mg/dL
HGB URINE DIPSTICK: NEGATIVE
Ketones, ur: NEGATIVE mg/dL
Leukocytes, UA: NEGATIVE
Nitrite: NEGATIVE
Protein, ur: NEGATIVE mg/dL
Specific Gravity, Urine: 1.015 (ref 1.005–1.030)
UROBILINOGEN UA: 0.2 mg/dL (ref 0.0–1.0)
pH: 5.5 (ref 5.0–8.0)

## 2013-09-15 LAB — CBC WITH DIFFERENTIAL/PLATELET
BASOS ABS: 0 10*3/uL (ref 0.0–0.1)
BASOS PCT: 0 % (ref 0–1)
EOS PCT: 1 % (ref 0–5)
Eosinophils Absolute: 0.1 10*3/uL (ref 0.0–0.7)
HCT: 38.2 % (ref 36.0–46.0)
Hemoglobin: 13.5 g/dL (ref 12.0–15.0)
Lymphocytes Relative: 20 % (ref 12–46)
Lymphs Abs: 2.5 10*3/uL (ref 0.7–4.0)
MCH: 31.3 pg (ref 26.0–34.0)
MCHC: 35.3 g/dL (ref 30.0–36.0)
MCV: 88.4 fL (ref 78.0–100.0)
MONO ABS: 1.2 10*3/uL — AB (ref 0.1–1.0)
Monocytes Relative: 10 % (ref 3–12)
Neutro Abs: 8.3 10*3/uL — ABNORMAL HIGH (ref 1.7–7.7)
Neutrophils Relative %: 69 % (ref 43–77)
Platelets: 280 10*3/uL (ref 150–400)
RBC: 4.32 MIL/uL (ref 3.87–5.11)
RDW: 14.2 % (ref 11.5–15.5)
WBC: 12 10*3/uL — ABNORMAL HIGH (ref 4.0–10.5)

## 2013-09-15 LAB — COMPREHENSIVE METABOLIC PANEL
ALBUMIN: 3.6 g/dL (ref 3.5–5.2)
ALT: 19 U/L (ref 0–35)
AST: 18 U/L (ref 0–37)
Alkaline Phosphatase: 62 U/L (ref 39–117)
BUN: 12 mg/dL (ref 6–23)
CALCIUM: 9.2 mg/dL (ref 8.4–10.5)
CO2: 26 mEq/L (ref 19–32)
CREATININE: 0.77 mg/dL (ref 0.50–1.10)
Chloride: 103 mEq/L (ref 96–112)
GFR calc Af Amer: >90 mL/min (ref >90)
Glucose, Bld: 93 mg/dL (ref 70–99)
Potassium: 4.2 mEq/L (ref 3.7–5.3)
Sodium: 140 mEq/L (ref 137–147)
Total Bilirubin: 0.5 mg/dL (ref 0.3–1.2)
Total Protein: 7.2 g/dL (ref 6.0–8.3)

## 2013-09-15 LAB — POCT PREGNANCY, URINE: PREG TEST UR: NEGATIVE

## 2013-09-15 LAB — LIPASE, BLOOD: LIPASE: 16 U/L (ref 11–59)

## 2013-09-15 LAB — CG4 I-STAT (LACTIC ACID): Lactic Acid, Venous: 0.91 mmol/L (ref 0.5–2.2)

## 2013-09-15 MED ORDER — HYDROMORPHONE HCL PF 1 MG/ML IJ SOLN
1.0000 mg | Freq: Once | INTRAMUSCULAR | Status: AC
  Administered 2013-09-15: 1 mg via INTRAVENOUS
  Filled 2013-09-15: qty 1

## 2013-09-15 MED ORDER — IOHEXOL 300 MG/ML  SOLN
25.0000 mL | INTRAMUSCULAR | Status: AC
  Administered 2013-09-15: 25 mL via ORAL

## 2013-09-15 MED ORDER — CIPROFLOXACIN IN D5W 400 MG/200ML IV SOLN
400.0000 mg | Freq: Once | INTRAVENOUS | Status: AC
  Administered 2013-09-15: 400 mg via INTRAVENOUS
  Filled 2013-09-15: qty 200

## 2013-09-15 MED ORDER — HYDROMORPHONE HCL PF 1 MG/ML IJ SOLN
1.0000 mg | INTRAMUSCULAR | Status: DC | PRN
  Administered 2013-09-15 – 2013-09-17 (×5): 1 mg via INTRAVENOUS
  Filled 2013-09-15 (×5): qty 1

## 2013-09-15 MED ORDER — METRONIDAZOLE IN NACL 5-0.79 MG/ML-% IV SOLN
500.0000 mg | Freq: Once | INTRAVENOUS | Status: AC
  Administered 2013-09-15: 500 mg via INTRAVENOUS
  Filled 2013-09-15: qty 100

## 2013-09-15 MED ORDER — ENOXAPARIN SODIUM 40 MG/0.4ML ~~LOC~~ SOLN
40.0000 mg | SUBCUTANEOUS | Status: DC
  Administered 2013-09-15 – 2013-09-18 (×4): 40 mg via SUBCUTANEOUS
  Filled 2013-09-15 (×7): qty 0.4

## 2013-09-15 MED ORDER — ONDANSETRON HCL 4 MG/2ML IJ SOLN
4.0000 mg | Freq: Once | INTRAMUSCULAR | Status: AC
  Administered 2013-09-15: 4 mg via INTRAVENOUS
  Filled 2013-09-15: qty 2

## 2013-09-15 MED ORDER — KCL IN DEXTROSE-NACL 20-5-0.9 MEQ/L-%-% IV SOLN
INTRAVENOUS | Status: DC
  Administered 2013-09-15 – 2013-09-19 (×7): via INTRAVENOUS
  Filled 2013-09-15 (×10): qty 1000

## 2013-09-15 MED ORDER — SODIUM CHLORIDE 0.9 % IV BOLUS (SEPSIS)
1000.0000 mL | Freq: Once | INTRAVENOUS | Status: AC
  Administered 2013-09-15: 1000 mL via INTRAVENOUS

## 2013-09-15 MED ORDER — ONDANSETRON HCL 4 MG/2ML IJ SOLN
4.0000 mg | Freq: Four times a day (QID) | INTRAMUSCULAR | Status: DC | PRN
  Administered 2013-09-16 – 2013-09-18 (×5): 4 mg via INTRAVENOUS
  Filled 2013-09-15 (×5): qty 2

## 2013-09-15 MED ORDER — CIPROFLOXACIN IN D5W 400 MG/200ML IV SOLN
400.0000 mg | Freq: Two times a day (BID) | INTRAVENOUS | Status: DC
  Administered 2013-09-16 – 2013-09-19 (×7): 400 mg via INTRAVENOUS
  Filled 2013-09-15 (×8): qty 200

## 2013-09-15 MED ORDER — HYDROCODONE-ACETAMINOPHEN 5-325 MG PO TABS
1.0000 | ORAL_TABLET | ORAL | Status: DC | PRN
  Administered 2013-09-15: 1 via ORAL
  Administered 2013-09-15 – 2013-09-16 (×2): 2 via ORAL
  Administered 2013-09-16: 1 via ORAL
  Administered 2013-09-16 – 2013-09-17 (×4): 2 via ORAL
  Administered 2013-09-17: 1 via ORAL
  Administered 2013-09-18 – 2013-09-19 (×5): 2 via ORAL
  Filled 2013-09-15 (×5): qty 2
  Filled 2013-09-15: qty 1
  Filled 2013-09-15 (×2): qty 2
  Filled 2013-09-15: qty 1
  Filled 2013-09-15: qty 2
  Filled 2013-09-15: qty 1
  Filled 2013-09-15 (×4): qty 2

## 2013-09-15 MED ORDER — METRONIDAZOLE IN NACL 5-0.79 MG/ML-% IV SOLN
500.0000 mg | Freq: Three times a day (TID) | INTRAVENOUS | Status: DC
  Administered 2013-09-15 – 2013-09-19 (×11): 500 mg via INTRAVENOUS
  Filled 2013-09-15 (×14): qty 100

## 2013-09-15 MED ORDER — IOHEXOL 300 MG/ML  SOLN
80.0000 mL | Freq: Once | INTRAMUSCULAR | Status: AC | PRN
  Administered 2013-09-15: 80 mL via INTRAVENOUS

## 2013-09-15 NOTE — H&P (Signed)
I have seen and examined the pt and agree with PA-Riebock's progress note. 2nd episode of diverticulitis within 1 year Admit NPO IV abx Pt didn't follow through with previous scheduled surgery due to family hardships.  Pt was to see Dr. Maisie Fushomas for c-scope and colon resection.

## 2013-09-15 NOTE — ED Notes (Addendum)
Pt has history of diverticulitis and was supposed to have surgery.  Pt is having pain to rectum and then to middle of left side.  No vomiting.  Pt has had tubal and reports did not get menstrual last month.

## 2013-09-15 NOTE — ED Provider Notes (Signed)
PT was diagnosed with diverticulitis in Dec of 2013. She has been followed by surgery, Dr. Maisie Fushomas and they were going to do a colectomy however she had several family illnesses and she could not get surgery done in a timely manner. She reports she started having lower central and left lower quadrant pain a couple days ago however 2 days ago she started having rectal pain that is intense. She did have nausea and vomiting but not now. She denies fever but has had chills.   Patient is alert and cooperative, she's laying on her right side. She appears to be uncomfortable.   14:07 Dr Margo AyeHall, radiology called CT results.   Ct Abdomen Pelvis W Contrast  09/15/2013   CLINICAL DATA:  43 year old female with severe lower abdominal pain and diarrhea. Initial encounter. History of diverticulitis, planned colon resection.  EXAM: CT ABDOMEN AND PELVIS WITH CONTRAST  TECHNIQUE: Multidetector CT imaging of the abdomen and pelvis was performed using the standard protocol following bolus administration of intravenous contrast.  CONTRAST:  80mL OMNIPAQUE IOHEXOL 300 MG/ML  SOLN  COMPARISON:  CT Abdomen and Pelvis 11/24/2012 and earlier.  FINDINGS: Negative lung bases. No pericardial or pleural effusion. No acute osseous abnormality identified. Lumbar spine facet degeneration. Small iliac bone islands.  Increased presacral stranding. Indistinctness of the sigmoid colon with evidence of wall thickening. Diffuse diverticulosis again noted. Rectum not definitely inflamed. Abnormal collection of air which appears to be outside of the main sigmoid lumen, situated in the cul-de-sac posterior to the vaginal fornix measuring 40 x 17 by 25 mm. Adjacent diverticula. Soft tissue planes obscured throughout this region. No abnormal gas in the bladder. Uterus not directly affected. No organized fluid collection.  Superimposed 52 mm diameter right adnexal cysts with complex fluid attenuation. This is new since March 2014. Negative left adnexa.   Diverticulosis continues in the distal descending colon without active inflammation. Negative splenic flexure. Redundant but negative transverse colon. Negative right colon and terminal ileum. Negative appendix. No dilated or inflamed small bowel identified. There is oral contrast in the stomach and proximal small bowel.  Negative liver, gallbladder, spleen, pancreas, adrenal glands, portal venous system, major arterial structures, and kidneys.  IMPRESSION: 1. Extensive inflammatory stranding in the pelvis and obscuration of soft tissue planes between the inflamed sigmoid colon, cul-de-sac and rectum. Diverticulitis with an abnormal extraluminal gas collection situated in the cul-de-sac measuring 40 x 17 x 25 mm compatible with contained perforation. No rectovaginal fistula suspected at this time. No drainable fluid collection identified. 2. Unrelated 52 mm right adnexal cyst. Favor physiologic but recommend ultrasound follow-up in 6-12 weeks. This recommendation follows ACR consensus guidelines: White Paper of the ACR Incidental Findings Committee II on Adnexal Findings. J Am Coll Radiol [id number]. Study discussed by telephone with Dr. Lynelle DoctorKnapp on 09/15/2013 at 14:07 .   Electronically Signed   By: Augusto GambleLee  Hall M.D.   On: 09/15/2013 14:07   Medical screening examination/treatment/procedure(s) were conducted as a shared visit with non-physician practitioner(s) and myself.  I personally evaluated the patient during the encounter.  EKG Interpretation   None        Devoria AlbeIva Noni Stonesifer, MD, Armando GangFACEP    Ward GivensIva L Geselle Cardosa, MD 09/15/13 [id number]

## 2013-09-15 NOTE — H&P (Signed)
Chief Complaint: abdominal pain, nausea  HPI: Heather [name] is a 43 year old female who presented to [facility] with LLQ abdominal pain.  Duration of symptoms is 3 days.  Onset was sudden.  Coarse is worsening.  Associated with nausea, diarrhea, chills.  Time pattern is constant, with intermittent worsening cramping type pain.  Location of pain is LLQ with radiation to low back and rectal region.  Aggravating factors; have a BM.  Alleviating factors; none.  Modifying factors; none.  Characterized as sharp burning pain.  Previous symptoms about 1 year ago which required hospitalization.  She followed up with Dr. Leighton Ruff in our office and had a colonoscopy followed by a colectomy, however, due to family circumstances, she never followed through with the surgery.  She had not had her surgery.  No significant family history of colon cancer.  Sister had breast cancer.  Denies recent weight loss, appetite.    Past Medical History  Diagnosis Date  . Diverticulitis     Past Surgical History  Procedure Laterality Date  . Cesarean section    . No past surgeries    . Abdominal surgery      Family History  Problem Relation Age of Onset  . Skin cancer Sister   . Breast cancer Sister    Social History:  reports that she has never smoked. She does not have any smokeless tobacco history on file. She reports that she drinks alcohol. She reports that she uses illicit drugs (Marijuana).  Allergies: No Known Allergies   (Not in a hospital admission)  Results for orders placed during the hospital encounter of 09/15/13 (from the past 48 hour(s))  CBC WITH DIFFERENTIAL     Status: Abnormal   Collection Time    09/15/13  9:32 AM      Result Value Range   WBC 12.0 (*) 4.0 - 10.5 K/uL   RBC 4.32  3.87 - 5.11 MIL/uL   Hemoglobin 13.5  12.0 - 15.0 g/dL   HCT 38.2  36.0 - 46.0 %   MCV 88.4  78.0 - 100.0 fL   MCH 31.3  26.0 - 34.0 pg   MCHC 35.3  30.0 - 36.0 g/dL   RDW 14.2  11.5 - 15.5 %   Platelets 280   150 - 400 K/uL   Neutrophils Relative % 69  43 - 77 %   Neutro Abs 8.3 (*) 1.7 - 7.7 K/uL   Lymphocytes Relative 20  12 - 46 %   Lymphs Abs 2.5  0.7 - 4.0 K/uL   Monocytes Relative 10  3 - 12 %   Monocytes Absolute 1.2 (*) 0.1 - 1.0 K/uL   Eosinophils Relative 1  0 - 5 %   Eosinophils Absolute 0.1  0.0 - 0.7 K/uL   Basophils Relative 0  0 - 1 %   Basophils Absolute 0.0  0.0 - 0.1 K/uL  COMPREHENSIVE METABOLIC PANEL     Status: None   Collection Time    09/15/13  9:32 AM      Result Value Range   Sodium 140  137 - 147 mEq/L   Potassium 4.2  3.7 - 5.3 mEq/L   Chloride 103  96 - 112 mEq/L   CO2 26  19 - 32 mEq/L   Glucose, Bld 93  70 - 99 mg/dL   BUN 12  6 - 23 mg/dL   Creatinine, Ser 0.77  0.50 - 1.10 mg/dL   Calcium 9.2  8.4 - 10.5 mg/dL  Total Protein 7.2  6.0 - 8.3 g/dL   Albumin 3.6  3.5 - 5.2 g/dL   AST 18  0 - 37 U/L   ALT 19  0 - 35 U/L   Alkaline Phosphatase 62  39 - 117 U/L   Total Bilirubin 0.5  0.3 - 1.2 mg/dL   GFR calc non Af Amer >90  >90 mL/min   GFR calc Af Amer >90  >90 mL/min   Comment: (NOTE)     The eGFR has been calculated using the CKD EPI equation.     This calculation has not been validated in all clinical situations.     eGFR's persistently <90 mL/min signify possible Chronic Kidney     Disease.  URINALYSIS, ROUTINE W REFLEX MICROSCOPIC     Status: Abnormal   Collection Time    09/15/13 11:16 AM      Result Value Range   Color, Urine YELLOW  YELLOW   APPearance CLOUDY (*) CLEAR   Specific Gravity, Urine 1.015  1.005 - 1.030   pH 5.5  5.0 - 8.0   Glucose, UA NEGATIVE  NEGATIVE mg/dL   Hgb urine dipstick NEGATIVE  NEGATIVE   Bilirubin Urine NEGATIVE  NEGATIVE   Ketones, ur NEGATIVE  NEGATIVE mg/dL   Protein, ur NEGATIVE  NEGATIVE mg/dL   Urobilinogen, UA 0.2  0.0 - 1.0 mg/dL   Nitrite NEGATIVE  NEGATIVE   Leukocytes, UA NEGATIVE  NEGATIVE   Comment: MICROSCOPIC NOT DONE ON URINES WITH NEGATIVE PROTEIN, BLOOD, LEUKOCYTES, NITRITE, OR GLUCOSE  <1000 mg/dL.  POCT PREGNANCY, URINE     Status: None   Collection Time    09/15/13 11:28 AM      Result Value Range   Preg Test, Ur NEGATIVE  NEGATIVE   Comment:            THE SENSITIVITY OF THIS     METHODOLOGY IS >24 mIU/mL  LIPASE, BLOOD     Status: None   Collection Time    09/15/13 11:34 AM      Result Value Range   Lipase 16  11 - 59 U/L  CG4 I-STAT (LACTIC ACID)     Status: None   Collection Time    09/15/13 12:09 PM      Result Value Range   Lactic Acid, Venous 0.91  0.5 - 2.2 mmol/L   Ct Abdomen Pelvis W Contrast  09/15/2013   CLINICAL DATA:  43 year old female with severe lower abdominal pain and diarrhea. Initial encounter. History of diverticulitis, planned colon resection.  EXAM: CT ABDOMEN AND PELVIS WITH CONTRAST  TECHNIQUE: Multidetector CT imaging of the abdomen and pelvis was performed using the standard protocol following bolus administration of intravenous contrast.  CONTRAST:  13m OMNIPAQUE IOHEXOL 300 MG/ML  SOLN  COMPARISON:  CT Abdomen and Pelvis 11/24/2012 and earlier.  FINDINGS: Negative lung bases. No pericardial or pleural effusion. No acute osseous abnormality identified. Lumbar spine facet degeneration. Small iliac bone islands.  Increased presacral stranding. Indistinctness of the sigmoid colon with evidence of wall thickening. Diffuse diverticulosis again noted. Rectum not definitely inflamed. Abnormal collection of air which appears to be outside of the main sigmoid lumen, situated in the cul-de-sac posterior to the vaginal fornix measuring 40 x 17 by 25 mm. Adjacent diverticula. Soft tissue planes obscured throughout this region. No abnormal gas in the bladder. Uterus not directly affected. No organized fluid collection.  Superimposed 52 mm diameter right adnexal cysts with complex fluid attenuation. This is new since  March 2014. Negative left adnexa.  Diverticulosis continues in the distal descending colon without active inflammation. Negative splenic flexure.  Redundant but negative transverse colon. Negative right colon and terminal ileum. Negative appendix. No dilated or inflamed small bowel identified. There is oral contrast in the stomach and proximal small bowel.  Negative liver, gallbladder, spleen, pancreas, adrenal glands, portal venous system, major arterial structures, and kidneys.  IMPRESSION: 1. Extensive inflammatory stranding in the pelvis and obscuration of soft tissue planes between the inflamed sigmoid colon, cul-de-sac and rectum. Diverticulitis with an abnormal extraluminal gas collection situated in the cul-de-sac measuring 40 x 17 x 25 mm compatible with contained perforation. No rectovaginal fistula suspected at this time. No drainable fluid collection identified. 2. Unrelated 52 mm right adnexal cyst. Favor physiologic but recommend ultrasound follow-up in 6-12 weeks. This recommendation follows ACR consensus guidelines: White Paper of the ACR Incidental Findings Committee II on Adnexal Findings. J Am Coll Radiol [id number]. Study discussed by telephone with Dr. Tomi Bamberger on 09/15/2013 at 14:07 .   Electronically Signed   By: Lars Pinks M.D.   On: 09/15/2013 14:07    Review of Systems  All other systems reviewed and are negative.    Blood pressure 107/75, pulse 76, temperature 98.2 F (36.8 C), temperature source Rectal, resp. rate 22, height 5' 2"  (1.575 m), weight 185 lb 11.2 oz (84.233 kg), last menstrual period 08/07/2013, SpO2 100.00%. Physical Exam  Constitutional: She is oriented to person, place, and time. She appears well-developed and well-nourished. No distress.  HENT:  Head: Normocephalic.  Mouth/Throat: No oropharyngeal exudate.  Neck: Normal range of motion. Neck supple.  Cardiovascular: Normal rate, regular rhythm, normal heart sounds and intact distal pulses.  Exam reveals no gallop and no friction rub.   No murmur heard. Respiratory: Effort normal and breath sounds normal. No respiratory distress. She has no wheezes.  She has no rales.  GI: Soft. Bowel sounds are normal. She exhibits no distension and no mass. There is no rebound and no guarding.  Moderate TTP to LLQ  Musculoskeletal: Normal range of motion. She exhibits no edema.  Neurological: She is alert and oriented to person, place, and time.  Skin: Skin is warm and dry. She is not diaphoretic.  Psychiatric: She has a normal mood and affect. Her behavior is normal. Judgment and thought content normal.     Assessment/Plan Diverticulitis with contained perforation Admit to inpatient for IV antibiotics and pain control.  Would like to avoid surgery in the acute setting.  She will need a colonoscopy as an outpatient. Cipro flagyl given in the ED, continue.   Bowel rest, may have ice chips IV hydration VTE prophylaxis, SCDs, lovenox   Irven Ingalsbe ANP-BC 09/15/2013, 2:59 PM

## 2013-09-15 NOTE — ED Notes (Signed)
Pt ambulatory, holding side and moaning while walking. Pt ambulated to restroom to attempt to provide urine sample without assistance denies wheelchair.

## 2013-09-15 NOTE — ED Notes (Signed)
Per surgeon- pt not going to surgery and can have ice chips

## 2013-09-15 NOTE — ED Notes (Signed)
Pharmacy called for repeat dose of flagyl and cipro- sts not to give at present time.

## 2013-09-15 NOTE — ED Provider Notes (Signed)
CSN: [id number]     Arrival date & time 09/15/13  0912 History   First MD Initiated Contact with Patient 09/15/13 1120     Chief Complaint  Patient presents with  . Abdominal Pain   (Consider location/radiation/quality/duration/timing/severity/associated sxs/prior Treatment) HPI Comments: Patient is 43 year old female with PMHx significant for diverticultitis with abscess formation in December of 2013.  She states that she was admitted to the hospital, had a percutaneous drain placed, and was supposed to follow up with Dr. Maisie Fushomas with surgery for surgical repair.  She reports that her brother and mother all died in 2014 and so she never had the chance to follow up.  She reports that starting 3 days ago she began to have worsening LLQ abdominal pain similar to the previous episode.  She reports nausea and vomiting but denies diarrhea.  States that she has not noticed blood or mucous in her bowel movements but they have progressively gotten hard over the past several days.  She reports straining to defecate.  She denies fever, chills, chest pain, shortness of breath, dysuria, hematuria, vaginal discharge or bleeding.  Patient is a 43 y.o. female presenting with abdominal pain. The history is provided by the patient. No language interpreter was used.  Abdominal Pain Pain location:  LLQ and suprapubic Pain quality: aching, bloating, sharp and stabbing   Pain radiates to:  Does not radiate Pain severity:  Severe Onset quality:  Gradual Duration:  3 days Timing:  Constant Progression:  Worsening Chronicity:  Recurrent Context: awakening from sleep   Context: not diet changes, not laxative use, not medication withdrawal, not recent illness, not recent travel, not retching, not sick contacts and not suspicious food intake   Relieved by:  Nothing Worsened by:  Nothing tried Ineffective treatments:  None tried Associated symptoms: anorexia, constipation, nausea and vomiting   Associated symptoms: no  chest pain, no chills, no cough, no diarrhea, no dysuria, no fatigue, no fever, no flatus, no hematemesis, no hematochezia, no hematuria, no vaginal bleeding and no vaginal discharge     Past Medical History  Diagnosis Date  . Diverticulitis    Past Surgical History  Procedure Laterality Date  . Cesarean section    . No past surgeries    . Abdominal surgery     Family History  Problem Relation Age of Onset  . Skin cancer Sister   . Breast cancer Sister    History  Substance Use Topics  . Smoking status: Never Smoker   . Smokeless tobacco: Not on file  . Alcohol Use: Yes   OB History   Grav Para Term Preterm Abortions TAB SAB Ect Mult Living                 Review of Systems  Constitutional: Negative for fever, chills and fatigue.  Respiratory: Negative for cough.   Cardiovascular: Negative for chest pain.  Gastrointestinal: Positive for nausea, vomiting, abdominal pain, constipation and anorexia. Negative for diarrhea, hematochezia, flatus and hematemesis.  Genitourinary: Negative for dysuria, hematuria, vaginal bleeding and vaginal discharge.  All other systems reviewed and are negative.    Allergies  Review of patient's allergies indicates no known allergies.  Home Medications   Current Outpatient Rx  Name  Route  Sig  Dispense  Refill  . docusate sodium (COLACE) 100 MG capsule   Oral   Take 300 mg by mouth daily.          BP 104/66  Pulse 63  Temp(Src) 98.2  F (36.8 C) (Rectal)  Resp 22  Ht 5\' 2"  (1.575 m)  Wt 185 lb 11.2 oz (84.233 kg)  BMI 33.96 kg/m2  SpO2 97%  LMP 08/07/2013 Physical Exam  Nursing note and vitals reviewed. Constitutional: She appears well-developed and well-nourished. No distress.  HENT:  Head: Normocephalic and atraumatic.  Right Ear: External ear normal.  Left Ear: External ear normal.  Nose: Nose normal.  Mouth/Throat: Oropharynx is clear and moist. No oropharyngeal exudate.  Eyes: Conjunctivae are normal. Pupils are  equal, round, and reactive to light. No scleral icterus.  Neck: Normal range of motion. Neck supple.  Lymphadenopathy:    She has no cervical adenopathy.    ED Course  Procedures (including critical care time) Labs Review Labs Reviewed  CBC WITH DIFFERENTIAL - Abnormal; Notable for the following:    WBC 12.0 (*)    Neutro Abs 8.3 (*)    Monocytes Absolute 1.2 (*)    All other components within normal limits  URINALYSIS, ROUTINE W REFLEX MICROSCOPIC - Abnormal; Notable for the following:    APPearance CLOUDY (*)    All other components within normal limits  COMPREHENSIVE METABOLIC PANEL  LIPASE, BLOOD  POCT PREGNANCY, URINE  CG4 I-STAT (LACTIC ACID)   Imaging Review No results found.  EKG Interpretation   None      Results for orders placed during the hospital encounter of 09/15/13  CBC WITH DIFFERENTIAL      Result Value Range   WBC 12.0 (*) 4.0 - 10.5 K/uL   RBC 4.32  3.87 - 5.11 MIL/uL   Hemoglobin 13.5  12.0 - 15.0 g/dL   HCT 62.1  30.8 - 65.7 %   MCV 88.4  78.0 - 100.0 fL   MCH 31.3  26.0 - 34.0 pg   MCHC 35.3  30.0 - 36.0 g/dL   RDW 84.6  96.2 - 95.2 %   Platelets 280  150 - 400 K/uL   Neutrophils Relative % 69  43 - 77 %   Neutro Abs 8.3 (*) 1.7 - 7.7 K/uL   Lymphocytes Relative 20  12 - 46 %   Lymphs Abs 2.5  0.7 - 4.0 K/uL   Monocytes Relative 10  3 - 12 %   Monocytes Absolute 1.2 (*) 0.1 - 1.0 K/uL   Eosinophils Relative 1  0 - 5 %   Eosinophils Absolute 0.1  0.0 - 0.7 K/uL   Basophils Relative 0  0 - 1 %   Basophils Absolute 0.0  0.0 - 0.1 K/uL  COMPREHENSIVE METABOLIC PANEL      Result Value Range   Sodium 140  137 - 147 mEq/L   Potassium 4.2  3.7 - 5.3 mEq/L   Chloride 103  96 - 112 mEq/L   CO2 26  19 - 32 mEq/L   Glucose, Bld 93  70 - 99 mg/dL   BUN 12  6 - 23 mg/dL   Creatinine, Ser 8.41  0.50 - 1.10 mg/dL   Calcium 9.2  8.4 - 32.4 mg/dL   Total Protein 7.2  6.0 - 8.3 g/dL   Albumin 3.6  3.5 - 5.2 g/dL   AST 18  0 - 37 U/L   ALT 19  0 -  35 U/L   Alkaline Phosphatase 62  39 - 117 U/L   Total Bilirubin 0.5  0.3 - 1.2 mg/dL   GFR calc non Af Amer >90  >90 mL/min   GFR calc Af Amer >90  >90 mL/min  URINALYSIS, ROUTINE W REFLEX MICROSCOPIC      Result Value Range   Color, Urine YELLOW  YELLOW   APPearance CLOUDY (*) CLEAR   Specific Gravity, Urine 1.015  1.005 - 1.030   pH 5.5  5.0 - 8.0   Glucose, UA NEGATIVE  NEGATIVE mg/dL   Hgb urine dipstick NEGATIVE  NEGATIVE   Bilirubin Urine NEGATIVE  NEGATIVE   Ketones, ur NEGATIVE  NEGATIVE mg/dL   Protein, ur NEGATIVE  NEGATIVE mg/dL   Urobilinogen, UA 0.2  0.0 - 1.0 mg/dL   Nitrite NEGATIVE  NEGATIVE   Leukocytes, UA NEGATIVE  NEGATIVE  LIPASE, BLOOD      Result Value Range   Lipase 16  11 - 59 U/L  POCT PREGNANCY, URINE      Result Value Range   Preg Test, Ur NEGATIVE  NEGATIVE  CG4 I-STAT (LACTIC ACID)      Result Value Range   Lactic Acid, Venous 0.91  0.5 - 2.2 mmol/L   Ct Abdomen Pelvis W Contrast  09/15/2013   CLINICAL DATA:  43 year old female with severe lower abdominal pain and diarrhea. Initial encounter. History of diverticulitis, planned colon resection.  EXAM: CT ABDOMEN AND PELVIS WITH CONTRAST  TECHNIQUE: Multidetector CT imaging of the abdomen and pelvis was performed using the standard protocol following bolus administration of intravenous contrast.  CONTRAST:  80mL OMNIPAQUE IOHEXOL 300 MG/ML  SOLN  COMPARISON:  CT Abdomen and Pelvis 11/24/2012 and earlier.  FINDINGS: Negative lung bases. No pericardial or pleural effusion. No acute osseous abnormality identified. Lumbar spine facet degeneration. Small iliac bone islands.  Increased presacral stranding. Indistinctness of the sigmoid colon with evidence of wall thickening. Diffuse diverticulosis again noted. Rectum not definitely inflamed. Abnormal collection of air which appears to be outside of the main sigmoid lumen, situated in the cul-de-sac posterior to the vaginal fornix measuring 40 x 17 by 25 mm.  Adjacent diverticula. Soft tissue planes obscured throughout this region. No abnormal gas in the bladder. Uterus not directly affected. No organized fluid collection.  Superimposed 52 mm diameter right adnexal cysts with complex fluid attenuation. This is new since March 2014. Negative left adnexa.  Diverticulosis continues in the distal descending colon without active inflammation. Negative splenic flexure. Redundant but negative transverse colon. Negative right colon and terminal ileum. Negative appendix. No dilated or inflamed small bowel identified. There is oral contrast in the stomach and proximal small bowel.  Negative liver, gallbladder, spleen, pancreas, adrenal glands, portal venous system, major arterial structures, and kidneys.  IMPRESSION: 1. Extensive inflammatory stranding in the pelvis and obscuration of soft tissue planes between the inflamed sigmoid colon, cul-de-sac and rectum. Diverticulitis with an abnormal extraluminal gas collection situated in the cul-de-sac measuring 40 x 17 x 25 mm compatible with contained perforation. No rectovaginal fistula suspected at this time. No drainable fluid collection identified. 2. Unrelated 52 mm right adnexal cyst. Favor physiologic but recommend ultrasound follow-up in 6-12 weeks. This recommendation follows ACR consensus guidelines: White Paper of the ACR Incidental Findings Committee II on Adnexal Findings. J Am Coll Radiol [phone]. Study discussed by telephone with Dr. Lynelle Doctor on 09/15/2013 at 14:07 .   Electronically Signed   By: Augusto Gamble M.D.   On: 09/15/2013 14:07      MDM  Diverticulitis Perforation of large bowel  Patient here with worsening LLQ abdominal pain similar to previous episodes of diverticulitis - mild leukocytosis, negative lactate, now with diffuse inflammatory changes in sigmoid colon and rectum with noted contained  perforation.  Spoke with general surgery who will see the patient here.  Hemodynamically stable at this  time.   Izola Price Marisue Humble, PA-C 09/15/13 1444

## 2013-09-15 NOTE — ED Notes (Signed)
CT notified pt labs resulted and has drank contrast.

## 2013-09-15 NOTE — Progress Notes (Signed)
Unit CM UR Completed by [facility] ED CM  W. Tytionna Cloyd RN  

## 2013-09-16 LAB — COMPREHENSIVE METABOLIC PANEL
ALT: 14 U/L (ref 0–35)
AST: 17 U/L (ref 0–37)
Albumin: 2.7 g/dL — ABNORMAL LOW (ref 3.5–5.2)
Alkaline Phosphatase: 53 U/L (ref 39–117)
BILIRUBIN TOTAL: 0.5 mg/dL (ref 0.3–1.2)
BUN: 8 mg/dL (ref 6–23)
CO2: 23 mEq/L (ref 19–32)
CREATININE: 0.79 mg/dL (ref 0.50–1.10)
Calcium: 8.2 mg/dL — ABNORMAL LOW (ref 8.4–10.5)
Chloride: 104 mEq/L (ref 96–112)
GFR calc Af Amer: >90 mL/min (ref >90)
GFR calc non Af Amer: >90 mL/min (ref >90)
Glucose, Bld: 86 mg/dL (ref 70–99)
Potassium: 4.2 mEq/L (ref 3.7–5.3)
Sodium: 136 mEq/L — ABNORMAL LOW (ref 137–147)
TOTAL PROTEIN: 5.9 g/dL — AB (ref 6.0–8.3)

## 2013-09-16 LAB — CBC
HCT: 31.1 % — ABNORMAL LOW (ref 36.0–46.0)
Hemoglobin: 11.3 g/dL — ABNORMAL LOW (ref 12.0–15.0)
MCH: 32 pg (ref 26.0–34.0)
MCHC: 36.3 g/dL — ABNORMAL HIGH (ref 30.0–36.0)
MCV: 88.1 fL (ref 78.0–100.0)
Platelets: 222 10*3/uL (ref 150–400)
RBC: 3.53 MIL/uL — ABNORMAL LOW (ref 3.87–5.11)
RDW: 14.3 % (ref 11.5–15.5)
WBC: 8.1 10*3/uL (ref 4.0–10.5)

## 2013-09-16 NOTE — Progress Notes (Signed)
Subjective: Pt states she is 50% better today.  Denies n/v.  Objective: Vital signs in last 24 hours: Temp:  [97.6 F (36.4 C)-98.6 F (37 C)] 98.4 F (36.9 C) (01/07 0510) Pulse Rate:  [52-89] 61 (01/07 0510) Resp:  [18-22] 18 (01/07 0510) BP: (94-121)/(54-82) 100/58 mmHg (01/07 0510) SpO2:  [96 %-100 %] 96 % (01/07 0510) Weight:  [185 lb (83.915 kg)-185 lb 11.2 oz (84.233 kg)] 185 lb (83.915 kg) (01/06 1936) Last BM Date: 09/12/13  Intake/Output from previous day: 01/06 0701 - 01/07 0700 In: 1393.8 [I.V.:993.8; IV Piggyback:400] Out: -  Intake/Output this shift:   PE General appearance: alert, appears stated age and no distress Resp: clear to auscultation bilaterally Cardio: regular rate and rhythm, S1, S2 normal, no murmur, click, rub or gallop GI: +bs, abdomen is soft ttp LLQ  Lab Results:   Recent Labs  09/15/13 0932 09/16/13 0525  WBC 12.0* 8.1  HGB 13.5 11.3*  HCT 38.2 31.1*  PLT 280 222   BMET  Recent Labs  09/15/13 0932 09/16/13 0525  NA 140 136*  K 4.2 4.2  CL 103 104  CO2 26 23  GLUCOSE 93 86  BUN 12 8  CREATININE 0.77 0.79  CALCIUM 9.2 8.2*   PT/INR No results found for this basename: LABPROT, INR,  in the last 72 hours ABG No results found for this basename: PHART, PCO2, PO2, HCO3,  in the last 72 hours  Studies/Results: Ct Abdomen Pelvis W Contrast  09/15/2013   CLINICAL DATA:  43 year old female with severe lower abdominal pain and diarrhea. Initial encounter. History of diverticulitis, planned colon resection.  EXAM: CT ABDOMEN AND PELVIS WITH CONTRAST  TECHNIQUE: Multidetector CT imaging of the abdomen and pelvis was performed using the standard protocol following bolus administration of intravenous contrast.  CONTRAST:  80mL OMNIPAQUE IOHEXOL 300 MG/ML  SOLN  COMPARISON:  CT Abdomen and Pelvis 11/24/2012 and earlier.  FINDINGS: Negative lung bases. No pericardial or pleural effusion. No acute osseous abnormality identified. Lumbar  spine facet degeneration. Small iliac bone islands.  Increased presacral stranding. Indistinctness of the sigmoid colon with evidence of wall thickening. Diffuse diverticulosis again noted. Rectum not definitely inflamed. Abnormal collection of air which appears to be outside of the main sigmoid lumen, situated in the cul-de-sac posterior to the vaginal fornix measuring 40 x 17 by 25 mm. Adjacent diverticula. Soft tissue planes obscured throughout this region. No abnormal gas in the bladder. Uterus not directly affected. No organized fluid collection.  Superimposed 52 mm diameter right adnexal cysts with complex fluid attenuation. This is new since March 2014. Negative left adnexa.  Diverticulosis continues in the distal descending colon without active inflammation. Negative splenic flexure. Redundant but negative transverse colon. Negative right colon and terminal ileum. Negative appendix. No dilated or inflamed small bowel identified. There is oral contrast in the stomach and proximal small bowel.  Negative liver, gallbladder, spleen, pancreas, adrenal glands, portal venous system, major arterial structures, and kidneys.  IMPRESSION: 1. Extensive inflammatory stranding in the pelvis and obscuration of soft tissue planes between the inflamed sigmoid colon, cul-de-sac and rectum. Diverticulitis with an abnormal extraluminal gas collection situated in the cul-de-sac measuring 40 x 17 x 25 mm compatible with contained perforation. No rectovaginal fistula suspected at this time. No drainable fluid collection identified. 2. Unrelated 52 mm right adnexal cyst. Favor physiologic but recommend ultrasound follow-up in 6-12 weeks. This recommendation follows ACR consensus guidelines: White Paper of the ACR Incidental Findings Committee II on Adnexal Findings.  J Am Coll Radiol [id number]:10:675-681. Study discussed by telephone with Dr. Lynelle DoctorKnapp on 09/15/2013 at 14:07 .   Electronically Signed   By: Augusto GambleLee  Hall M.D.   On: 09/15/2013 14:07     Anti-infectives: Anti-infectives   Start     Dose/Rate Route Frequency Ordered Stop   09/16/13 0200  ciprofloxacin (CIPRO) IVPB 400 mg     400 mg 200 mL/hr over 60 Minutes Intravenous Every 12 hours 09/15/13 1516     09/15/13 2300  metroNIDAZOLE (FLAGYL) IVPB 500 mg     500 mg 100 mL/hr over 60 Minutes Intravenous Every 8 hours 09/15/13 1516     09/15/13 1415  ciprofloxacin (CIPRO) IVPB 400 mg     400 mg 200 mL/hr over 60 Minutes Intravenous  Once 09/15/13 1413 09/15/13 1543   09/15/13 1415  metroNIDAZOLE (FLAGYL) IVPB 500 mg     500 mg 100 mL/hr over 60 Minutes Intravenous  Once 09/15/13 1413 09/15/13 1656      Assessment/Plan: Diverticulitis with contained perforation  White count improved, pain is improved, still exhibits tenderness.  Continue with bowel rest antibiotics. Cipro/flagyl 09/15/12---> IV hydration VTE prophylaxis, SCDs, lovenox Pain control Outpatient colonoscopy and follow up with Dr. Maisie Fushomas   LOS: 1 day    Bonner PunaRIEBOCK, [facility] ANP-BC Pager [phone] 09/16/2013 8:16 AM

## 2013-09-16 NOTE — Progress Notes (Signed)
I have seen and examined the pt and agree with NP-Reibock's progress note. Feels better today Con't IV abx

## 2013-09-17 NOTE — Progress Notes (Signed)
Examined patient.  She tried some clear liquids and began having much worse LLQ pain.  Vitals signs ok.  Will back off to ice chips only.  Continue abx.  Wilmon ArmsMatthew K. Corliss Skainssuei, MD, [facility]  General/ Trauma Surgery  09/17/2013 2:13 PM

## 2013-09-17 NOTE — Progress Notes (Signed)
Subjective: Pt feels a bit better.  No diarrhea, no nausea or vomiting.  C/o sweats.  Afebrile.  VSS.  No dysuria. BP soft.  Objective: Vital signs in last 24 hours: Temp:  [97.7 F (36.5 C)-99.2 F (37.3 C)] 97.7 F (36.5 C) (01/08 0620) Pulse Rate:  [63-65] 63 (01/08 0620) Resp:  [16-18] 18 (01/08 0620) BP: (92-95)/(47-55) 93/47 mmHg (01/08 0620) SpO2:  [97 %-98 %] 98 % (01/08 0620) Last BM Date: 09/13/13  Intake/Output from previous day: 01/07 0701 - 01/08 0700 In: 2007.5 [I.V.:1707.5; IV Piggyback:300] Out: 1200 [Urine:1200] Intake/Output this shift:   PE  General appearance: alert, appears stated age and no distress  Resp: clear to auscultation bilaterally  Cardio: regular rate and rhythm, S1, S2 normal, no murmur, click, rub or gallop  GI: +bs, abdomen is soft ttp LLQ  Lab Results:   Recent Labs  09/15/13 0932 09/16/13 0525  WBC 12.0* 8.1  HGB 13.5 11.3*  HCT 38.2 31.1*  PLT 280 222   BMET  Recent Labs  09/15/13 0932 09/16/13 0525  NA 140 136*  K 4.2 4.2  CL 103 104  CO2 26 23  GLUCOSE 93 86  BUN 12 8  CREATININE 0.77 0.79  CALCIUM 9.2 8.2*   PT/INR No results found for this basename: LABPROT, INR,  in the last 72 hours ABG No results found for this basename: PHART, PCO2, PO2, HCO3,  in the last 72 hours  Studies/Results: Ct Abdomen Pelvis W Contrast  09/15/2013   CLINICAL DATA:  43 year old female with severe lower abdominal pain and diarrhea. Initial encounter. History of diverticulitis, planned colon resection.  EXAM: CT ABDOMEN AND PELVIS WITH CONTRAST  TECHNIQUE: Multidetector CT imaging of the abdomen and pelvis was performed using the standard protocol following bolus administration of intravenous contrast.  CONTRAST:  80mL OMNIPAQUE IOHEXOL 300 MG/ML  SOLN  COMPARISON:  CT Abdomen and Pelvis 11/24/2012 and earlier.  FINDINGS: Negative lung bases. No pericardial or pleural effusion. No acute osseous abnormality identified. Lumbar spine facet  degeneration. Small iliac bone islands.  Increased presacral stranding. Indistinctness of the sigmoid colon with evidence of wall thickening. Diffuse diverticulosis again noted. Rectum not definitely inflamed. Abnormal collection of air which appears to be outside of the main sigmoid lumen, situated in the cul-de-sac posterior to the vaginal fornix measuring 40 x 17 by 25 mm. Adjacent diverticula. Soft tissue planes obscured throughout this region. No abnormal gas in the bladder. Uterus not directly affected. No organized fluid collection.  Superimposed 52 mm diameter right adnexal cysts with complex fluid attenuation. This is new since March 2014. Negative left adnexa.  Diverticulosis continues in the distal descending colon without active inflammation. Negative splenic flexure. Redundant but negative transverse colon. Negative right colon and terminal ileum. Negative appendix. No dilated or inflamed small bowel identified. There is oral contrast in the stomach and proximal small bowel.  Negative liver, gallbladder, spleen, pancreas, adrenal glands, portal venous system, major arterial structures, and kidneys.  IMPRESSION: 1. Extensive inflammatory stranding in the pelvis and obscuration of soft tissue planes between the inflamed sigmoid colon, cul-de-sac and rectum. Diverticulitis with an abnormal extraluminal gas collection situated in the cul-de-sac measuring 40 x 17 x 25 mm compatible with contained perforation. No rectovaginal fistula suspected at this time. No drainable fluid collection identified. 2. Unrelated 52 mm right adnexal cyst. Favor physiologic but recommend ultrasound follow-up in 6-12 weeks. This recommendation follows ACR consensus guidelines: White Paper of the ACR Incidental Findings Committee II on Adnexal  Findings. J Am Coll Radiol [phone]. Study discussed by telephone with Dr. Lynelle DoctorKnapp on 09/15/2013 at 14:07 .   Electronically Signed   By: Augusto GambleLee  Hall M.D.   On: 09/15/2013 14:07     Anti-infectives: Anti-infectives   Start     Dose/Rate Route Frequency Ordered Stop   09/16/13 0200  ciprofloxacin (CIPRO) IVPB 400 mg     400 mg 200 mL/hr over 60 Minutes Intravenous Every 12 hours 09/15/13 1516     09/15/13 2300  metroNIDAZOLE (FLAGYL) IVPB 500 mg     500 mg 100 mL/hr over 60 Minutes Intravenous Every 8 hours 09/15/13 1516     09/15/13 1415  ciprofloxacin (CIPRO) IVPB 400 mg     400 mg 200 mL/hr over 60 Minutes Intravenous  Once 09/15/13 1413 09/15/13 1543   09/15/13 1415  metroNIDAZOLE (FLAGYL) IVPB 500 mg     500 mg 100 mL/hr over 60 Minutes Intravenous  Once 09/15/13 1413 09/15/13 1656      Assessment/Plan: Diverticulitis with contained perforation  White count normalized 1/7, pain is better.  Start on clear liquid diet.  If pain worsens, will consider changing antibiotics, repeating CT Scan. Cipro/flagyl 09/15/12--->  IV hydration  VTE prophylaxis, SCDs, lovenox  Pain control  Outpatient colonoscopy and follow up with Dr. Maisie Fushomas   LOS: 2 days    Bonner PunaRIEBOCK, [facility] ServicesEM[facility] ANP-BC Pager [phone] 09/17/2013 8:24 AM

## 2013-09-18 DIAGNOSIS — F329 Major depressive disorder, single episode, unspecified: Secondary | ICD-10-CM

## 2013-09-18 DIAGNOSIS — F3289 Other specified depressive episodes: Secondary | ICD-10-CM

## 2013-09-18 LAB — CBC
HCT: 32.9 % — ABNORMAL LOW (ref 36.0–46.0)
Hemoglobin: 11.2 g/dL — ABNORMAL LOW (ref 12.0–15.0)
MCH: 30.3 pg (ref 26.0–34.0)
MCHC: 34 g/dL (ref 30.0–36.0)
MCV: 88.9 fL (ref 78.0–100.0)
PLATELETS: 290 10*3/uL (ref 150–400)
RBC: 3.7 MIL/uL — AB (ref 3.87–5.11)
RDW: 13.9 % (ref 11.5–15.5)
WBC: 5.9 10*3/uL (ref 4.0–10.5)

## 2013-09-18 LAB — BASIC METABOLIC PANEL
BUN: 7 mg/dL (ref 6–23)
CHLORIDE: 106 meq/L (ref 96–112)
CO2: 22 mEq/L (ref 19–32)
Calcium: 8.6 mg/dL (ref 8.4–10.5)
Creatinine, Ser: 0.82 mg/dL (ref 0.50–1.10)
GFR calc non Af Amer: 86 mL/min — ABNORMAL LOW (ref >90)
Glucose, Bld: 86 mg/dL (ref 70–99)
POTASSIUM: 4.3 meq/L (ref 3.7–5.3)
Sodium: 138 mEq/L (ref 137–147)

## 2013-09-18 MED ORDER — SERTRALINE HCL 50 MG PO TABS
50.0000 mg | ORAL_TABLET | Freq: Every day | ORAL | Status: DC
  Administered 2013-09-18 – 2013-09-19 (×2): 50 mg via ORAL
  Filled 2013-09-18 (×3): qty 1

## 2013-09-18 MED ORDER — FLUCONAZOLE 200 MG PO TABS
200.0000 mg | ORAL_TABLET | Freq: Once | ORAL | Status: AC
  Administered 2013-09-18: 200 mg via ORAL
  Filled 2013-09-18: qty 1

## 2013-09-18 NOTE — Progress Notes (Signed)
Will restart clears Hopefully will be able to advance diet and switch to PO abx.  Wilmon ArmsMatthew K. Corliss Skainssuei, MD, [facility]  General/ Trauma Surgery  09/18/2013 12:12 PM

## 2013-09-18 NOTE — Progress Notes (Signed)
Patient ID: Joyce CopaDenise Rallo, female   DOB: 1971/01/16, 43 y.o.   MRN: [id number]   Subjective: Pt had pain after clears yesterday back down to ice chips.  Reports her pain is better.  No n/v.  She is tearful, brother died of a heart attack in April, mom died from lung cancer in November.  No SIs.   Objective:  Vital signs:  Filed Vitals:   09/17/13 1142 09/17/13 1454 09/17/13 2103 09/18/13 0536  BP: 103/64 112/72 115/68   Pulse: 67 61 62 58  Temp: 98.3 F (36.8 C) 98.4 F (36.9 C) 98.5 F (36.9 C) 98.2 F (36.8 C)  TempSrc: Oral Oral Oral   Resp: 20 17 18 18   Height:      Weight:      SpO2: 100% 100% 100% 98%    Last BM Date: 09/13/13  Intake/Output   Yesterday:  01/08 0701 - 01/09 0700 In: 2800 [I.V.:1800; IV Piggyback:1000] Out: -  This shift:    Physical Exam: General appearance: alert, appears stated age and no distress  Resp: clear to auscultation bilaterally  Cardio: regular rate and rhythm, S1, S2 normal, no murmur, click, rub or gallop  GI: +bs, abdomen is soft ttp LLQ  Problem List:   Active Problems:   Acute diverticulitis    Results:   Labs: Results for orders placed during the hospital encounter of 09/15/13 (from the past 48 hour(s))  CBC     Status: Abnormal   Collection Time    09/18/13  6:28 AM      Result Value Range   WBC 5.9  4.0 - 10.5 K/uL   RBC 3.70 (*) 3.87 - 5.11 MIL/uL   Hemoglobin 11.2 (*) 12.0 - 15.0 g/dL   HCT 40.932.9 (*) 81.136.0 - 91.446.0 %   MCV 88.9  78.0 - 100.0 fL   MCH 30.3  26.0 - 34.0 pg   MCHC 34.0  30.0 - 36.0 g/dL   RDW 78.213.9  95.611.5 - 21.315.5 %   Platelets 290  150 - 400 K/uL    Imaging / Studies: No results found.  Medications / Allergies: per chart  Antibiotics: Anti-infectives   Start     Dose/Rate Route Frequency Ordered Stop   09/16/13 0200  ciprofloxacin (CIPRO) IVPB 400 mg     400 mg 200 mL/hr over 60 Minutes Intravenous Every 12 hours 09/15/13 1516     09/15/13 2300  metroNIDAZOLE (FLAGYL) IVPB 500 mg     500  mg 100 mL/hr over 60 Minutes Intravenous Every 8 hours 09/15/13 1516     09/15/13 1415  ciprofloxacin (CIPRO) IVPB 400 mg     400 mg 200 mL/hr over 60 Minutes Intravenous  Once 09/15/13 1413 09/15/13 1543   09/15/13 1415  metroNIDAZOLE (FLAGYL) IVPB 500 mg     500 mg 100 mL/hr over 60 Minutes Intravenous  Once 09/15/13 1413 09/15/13 1656      Assessment/Plan:  Diverticulitis with contained perforation  White count remains normal, pain is better. Cipro/flagyl 09/15/12--->  IV hydration  VTE prophylaxis, SCDs, lovenox  Pain control  Outpatient colonoscopy and follow up with Dr. Maisie Fushomas Diflucan x1 dose for vaginitis   Depression -start sertraline, outpatient PCP follow up.  Medication risks, benefits and therapeutic alternatives discussed.   Ashok NorrisEmina Aleigh Grunden, [facility] CenterNP-BC [facility] Pager [phone] Office [phone]  09/18/2013 7:59 AM

## 2013-09-19 MED ORDER — SERTRALINE HCL 50 MG PO TABS: 50.0000 mg | ORAL_TABLET | Freq: Every day | ORAL | Status: DC

## 2013-09-19 MED ORDER — HYDROCODONE-ACETAMINOPHEN 5-325 MG PO TABS: 1.0000 | ORAL_TABLET | ORAL | Status: DC | PRN

## 2013-09-19 MED ORDER — METRONIDAZOLE 500 MG PO TABS: 500.0000 mg | ORAL_TABLET | Freq: Three times a day (TID) | ORAL | Status: DC

## 2013-09-19 MED ORDER — CIPROFLOXACIN HCL 500 MG PO TABS: 500.0000 mg | ORAL_TABLET | Freq: Two times a day (BID) | ORAL | Status: DC

## 2013-09-19 NOTE — Progress Notes (Signed)
Pt for discharge today accomp by family.  Rx given and explained as well as copy of dc instructions.

## 2013-09-19 NOTE — Discharge Summary (Signed)
   Patient ID: Heather [name] [id number] 42 y.o. 1970-10-26  09/15/2013  Discharge date and time: 09/19/2013   Admitting Physician: Glenna FellowsHOXWORTH,Kahron Kauth T  Discharge Physician: Glenna FellowsHOXWORTH,Rinoa Garramone T  Admission Diagnoses: Diverticulitis large intestine [562.11] Perforated bowel [569.83]  Discharge Diagnoses: same  Operations: none  Admission Condition: fair  Discharged Condition: good  Indication for Admission: patient is a 43 year old female who presents with acute lower abdominal pain. She has one previous episode of diverticulitis. CT scan showed evidence of acute diverticulitis involving the distal sigmoid colon with an apparent contained perforation with a small amount of air adjacent to the colon. The patient was admitted and made n.p.o. And placed on IV antibiotics.   Hospital Course:She steadily improved. White count normalized. She was started on a clear liquid diet which he tolerated. On the day of admission she is not having any significant pain. Minimal left lower quadrant tenderness. She will be discharged home on oral Cipro and Flagyl for 10 days with followup arranged in the office.   Treatments: IV hydration and antibiotics: Cipro and metronidazole  Disposition: Home  Patient Instructions:    Medication List         ciprofloxacin 500 MG tablet  Commonly known as:  CIPRO  Take 1 tablet (500 mg total) by mouth 2 (two) times daily.     docusate sodium 100 MG capsule  Commonly known as:  COLACE  Take 300 mg by mouth daily.     HYDROcodone-acetaminophen 5-325 MG per tablet  Commonly known as:  NORCO/VICODIN  Take 1-2 tablets by mouth every 4 (four) hours as needed.     metroNIDAZOLE 500 MG tablet  Commonly known as:  FLAGYL  Take 1 tablet (500 mg total) by mouth 3 (three) times daily.        Activity: activity as tolerated Diet: full liquid diet to gradually advance to regular Wound Care: none needed  Follow-up:  With Dr. Corliss Skainssuei in 2  weeks.  Signed: Mariella SaaBenjamin T Broden Holt MD, FACS  09/19/2013, 8:22 AM

## 2013-09-19 NOTE — Discharge Instructions (Signed)
Diverticulitis °A diverticulum is a small pouch or sac on the colon. Diverticulosis is the presence of these diverticula on the colon. Diverticulitis is the irritation (inflammation) or infection of diverticula. °CAUSES  °The colon and its diverticula contain bacteria. If food particles block the tiny opening to a diverticulum, the bacteria inside can grow and cause an increase in pressure. This leads to infection and inflammation and is called diverticulitis. °SYMPTOMS  °· Abdominal pain and tenderness. Usually, the pain is located on the left side of your abdomen. However, it could be located elsewhere. °· Fever. °· Bloating. °· Feeling sick to your stomach (nausea). °· Throwing up (vomiting). °· Abnormal stools. °DIAGNOSIS  °Your caregiver will take a history and perform a physical exam. Since many things can cause abdominal pain, other tests may be necessary. Tests may include: °· Blood tests. °· Urine tests. °· X-ray of the abdomen. °· CT scan of the abdomen. °Sometimes, surgery is needed to determine if diverticulitis or other conditions are causing your symptoms. °TREATMENT  °Most of the time, you can be treated without surgery. Treatment includes: °· Resting the bowels by only having liquids for a few days. As you improve, you will need to eat a low-fiber diet. °· Intravenous (IV) fluids if you are losing body fluids (dehydrated). °· Antibiotic medicines that treat infections may be given. °· Pain and nausea medicine, if needed. °· Surgery if the inflamed diverticulum has burst. °HOME CARE INSTRUCTIONS  °· Try a clear liquid diet (broth, tea, or water for as long as directed by your caregiver). You may then gradually begin a low-fiber diet as tolerated.  °A low-fiber diet is a diet with less than 10 grams of fiber. Choose the foods below to reduce fiber in the diet: °· White breads, cereals, rice, and pasta. °· Cooked fruits and vegetables or soft fresh fruits and vegetables without the skin. °· Ground or  well-cooked tender beef, ham, veal, lamb, pork, or poultry. °· Eggs and seafood. °· After your diverticulitis symptoms have improved, your caregiver may put you on a high-fiber diet. A high-fiber diet includes 14 grams of fiber for every 1000 calories consumed. For a standard 2000 calorie diet, you would need 28 grams of fiber. Follow these diet guidelines to help you increase the fiber in your diet. It is important to slowly increase the amount fiber in your diet to avoid gas, constipation, and bloating. °· Choose whole-grain breads, cereals, pasta, and brown rice. °· Choose fresh fruits and vegetables with the skin on. Do not overcook vegetables because the more vegetables are cooked, the more fiber is lost. °· Choose more nuts, seeds, legumes, dried peas, beans, and lentils. °· Look for food products that have greater than 3 grams of fiber per serving on the Nutrition Facts label. °· Take all medicine as directed by your caregiver. °· If your caregiver has given you a follow-up appointment, it is very important that you go. Not going could result in lasting (chronic) or permanent injury, pain, and disability. If there is any problem keeping the appointment, call to reschedule. °SEEK MEDICAL CARE IF:  °· Your pain does not improve. °· You have a hard time advancing your diet beyond clear liquids. °· Your bowel movements do not return to normal. °SEEK IMMEDIATE MEDICAL CARE IF:  °· Your pain becomes worse. °· You have an oral temperature above 102° F (38.9° C), not controlled by medicine. °· You have repeated vomiting. °· You have bloody or black, tarry stools. °·   Symptoms that brought you to your caregiver become worse or are not getting better. °MAKE SURE YOU:  °· Understand these instructions. °· Will watch your condition. °· Will get help right away if you are not doing well or get worse. °Document Released: 06/06/2005 Document Revised: 11/19/2011 Document Reviewed: 10/02/2010 °[employer]® Patient Information  ©2014 [employer]. ° °

## 2013-09-19 NOTE — Progress Notes (Signed)
Patient ID: Heather CopaDenise [name], female   DOB: 02-Aug-1971, 43 y.o.   MRN: [id number]    Subjective: Denies any pain. Tolerating a liquid diet. No bowel movement.  Objective: Vital signs in last 24 hours: Temp:  [97.9 F (36.6 C)-98.4 F (36.9 C)] 97.9 F (36.6 C) (01/10 0545) Pulse Rate:  [54-56] 55 (01/10 0545) Resp:  [18-20] 18 (01/10 0545) BP: (80-112)/(67-74) 100/74 mmHg (01/10 0545) SpO2:  [95 %-97 %] 96 % (01/10 0545) Last BM Date: 09/13/13  Intake/Output from previous day: 01/09 0701 - 01/10 0700 In: 757 [I.V.:757] Out: -  Intake/Output this shift:    General appearance: alert, cooperative and no distress GI: mild left lower quadrant tenderness without guarding.  Lab Results:   Recent Labs  09/18/13 0628  WBC 5.9  HGB 11.2*  HCT 32.9*  PLT 290   BMET  Recent Labs  09/18/13 0628  NA 138  K 4.3  CL 106  CO2 22  GLUCOSE 86  BUN 7  CREATININE 0.82  CALCIUM 8.6     Studies/Results: No results found.  Anti-infectives: Anti-infectives   Start     Dose/Rate Route Frequency Ordered Stop   09/18/13 0900  fluconazole (DIFLUCAN) tablet 200 mg     200 mg Oral  Once 09/18/13 0758 09/18/13 0945   09/16/13 0200  ciprofloxacin (CIPRO) IVPB 400 mg     400 mg 200 mL/hr over 60 Minutes Intravenous Every 12 hours 09/15/13 1516     09/15/13 2300  metroNIDAZOLE (FLAGYL) IVPB 500 mg     500 mg 100 mL/hr over 60 Minutes Intravenous Every 8 hours 09/15/13 1516     09/15/13 1415  ciprofloxacin (CIPRO) IVPB 400 mg     400 mg 200 mL/hr over 60 Minutes Intravenous  Once 09/15/13 1413 09/15/13 1543   09/15/13 1415  metroNIDAZOLE (FLAGYL) IVPB 500 mg     500 mg 100 mL/hr over 60 Minutes Intravenous  Once 09/15/13 1413 09/15/13 1656      Assessment/Plan: Acute diverticulitis. Steadily improving. Patient feels able to go home I believe she is ready for discharge. Continue oral Cipro and Flagyl for one week at home. Gradually advance diet. Followup in the office in 2  weeks.    LOS: 4 days    Keith Felten T 09/19/2013

## 2013-09-19 NOTE — Progress Notes (Signed)
Pt ready to leave, all instructions reviewed, Rx given.

## 2013-10-12 ENCOUNTER — Ambulatory Visit (INDEPENDENT_AMBULATORY_CARE_PROVIDER_SITE_OTHER): Payer: Medicaid Other | Admitting: General Surgery

## 2013-11-09 ENCOUNTER — Encounter (INDEPENDENT_AMBULATORY_CARE_PROVIDER_SITE_OTHER): Payer: Self-pay | Admitting: General Surgery

## 2013-11-09 ENCOUNTER — Ambulatory Visit (INDEPENDENT_AMBULATORY_CARE_PROVIDER_SITE_OTHER): Payer: Medicaid Other | Admitting: General Surgery

## 2013-11-09 VITALS — BP 132/76 | HR 86 | Temp 98.3°F | Resp 16 | Ht 62.0 in | Wt 188.0 lb

## 2013-11-09 DIAGNOSIS — K5790 Diverticulosis of intestine, part unspecified, without perforation or abscess without bleeding: Secondary | ICD-10-CM

## 2013-11-09 DIAGNOSIS — K5730 Diverticulosis of large intestine without perforation or abscess without bleeding: Secondary | ICD-10-CM

## 2013-11-09 MED ORDER — METRONIDAZOLE 500 MG PO TABS: 500.0000 mg | ORAL_TABLET | ORAL | Status: DC

## 2013-11-09 NOTE — Patient Instructions (Signed)
[facility]  ONE-DAY (1) PRE-OP HOME COLON PREP INSTRUCTIONS: ** MIRALAX / GATORADE PREP / FLAGYL**  You must follow the instructions below carefully.  If you have questions or problems, please call and speak to someone in the clinic department at our office:   [phone].     INSTRUCTIONS: 1. Five days prior to your procedure do not eat nuts, popcorn, or fruit with seeds.  Stop all fiber supplements such as Metamucil, Citrucel, etc. 2. Two days before surgery fill the prescription at a pharmacy of your choice and purchase the additional supplies below.         MIRALAX - GATORADE -- DULCOLAX TABS -- FLEET ENEMA:   Purchase a bottle of MIRALAX  (255 gm bottle)    In addition, purchase four (4) DULCOLAX TABLETS (no prescription required- ask the pharmacist if you can't find them)   Purchase one Fleet Enema (Green and white box)    Purchase one 64 oz GATORADE.  (Do NOT purchase red Gatorade; any other flavor is acceptable) and place in refrigerator to get cold.  3.   Day Before Surgery:   6 am: Wash you abdomen with soap and repeat this on the morning of surgery and take 4 Dulcolax tablets   You may only have clear liquids (tea, coffee, juice, broth, jello, soft drinks, gummy bears).  You cannot have solid foods, cream, milk or milk products.  Drink at lease 8 ounces of liquids every hour while awake.   Take the Flagyl prescription as directed at 8 am, 2 pm and 8 pm.  It is helpful to take this with some jello instead of on an empty stomach.  Any flavor is ok, except red jello, which will cause red stools.   Mix the entire bottle of MiraLax and the Gatorade in a large container.    10:00am: Begin drinking the Gatorade mixture until gone (8 oz every 15-30 minutes).      You may suck on a lime wedge or hard candy to "freshen your palate" in between glasses   If you are a diabetic, take your blood sugar reading several time throughout the prep.  Have some juice available to take if your  sugar level gets too low   You may feel chilled while taking the prep.  Have some warm tea or broth to help warm up.   Continue clear liquids until midnight or bedtime  3. The day of your procedure:   Do not eat or drink ANYTHING after midnight before your surgery.     If you take Heart or Blood Pressure medicine, ask the pre-op nurses about these during your preop appointment.   Take a regular Fleet Enema one hour before leaving the come to the hospital.  Try to hold it in 5-10 mins before expelling.   Further pre-operative instructions will be given to you from the hospital.   Expect to be contacted 5-7 days before your surgery.     

## 2013-11-10 NOTE — Progress Notes (Signed)
Joyce CopaDenise Sinyard is a 43 y.o. female who is here for a follow up visit regarding her diverticular disease.  She was recently hospitalized for another episode in Jan.  She has completed her antibiotics and her pain is better.  She is having regular BM's.  She occasionally has a pain in her lower pelvis.  She denies any bleeding.    Objective: Filed Vitals:   11/09/13 1548  BP: 132/76  Pulse: 86  Temp: 98.3 F (36.8 C)  Resp: 16    General appearance: alert and cooperative GI: normal findings: soft, non-tender   Assessment and Plan: Joyce CopaDenise Hornstein is a 43 y.o. 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  We once again discussed surgery, and I believe she is ready to proceed.  The surgery and anatomy were described to the patient as well as the risks of surgery and the possible complications.  Given her anatomy on CT scan, I do not think it is possible to get a colonoscopy prior to surgery.  We will try to perform this after surgery if needed.  She is not of screening age for colon cancer.  She will also need marking of her ureters.  If we cannot do the surgery robotically due to scheduling, we will need urology to place bilateral ureteral stents prior to surgery.  We discussed all major risks of surgery.  These include: Bleeding, infection and possible wound complications such as hernia, damage to adjacent structures, leak of surgical connections, which can lead to other surgeries and possibly an ostomy (5-7%), possible need for other procedures, such as abscess drains in radiology, possible prolonged hospital stay, possible diarrhea from removal of part of the colon, possible constipation from narcotics, prolonged fatigue/weakness or appetite loss, possible recurrence of disease, possible complications of their medical problems such as heart disease or arrhythmias or lung problems, death (less than 1%).  I believe the patient understands and wishes to proceed with the  surgery.     Vanita PandaAlicia C Marianna Cid, MD [facility], [place] [phone]

## 2013-11-24 ENCOUNTER — Telehealth (INDEPENDENT_AMBULATORY_CARE_PROVIDER_SITE_OTHER): Payer: Self-pay | Admitting: *Deleted

## 2013-11-24 ENCOUNTER — Encounter (INDEPENDENT_AMBULATORY_CARE_PROVIDER_SITE_OTHER): Payer: Self-pay

## 2013-11-24 NOTE — Telephone Encounter (Signed)
Patient called requesting a letter be written for her work stating she must have surgery on 12/08/13.  Patient also asked for new instructions for her bowel prep because she has misplaced them.  Patient states she needs all of this by tomorrow morning at the latest.  Explained that I would send a message to Dr. Maisie Fushomas and her nurse then they will contact her with an update.  Patient states understanding and agreeable at this time.

## 2013-11-24 NOTE — Telephone Encounter (Signed)
Work note is fine.  Bowel prep instructions below.   [facility]  ONE-DAY (1) PRE-OP HOME COLON PREP INSTRUCTIONS: ** MIRALAX / GATORADE PREP / FLAGYL**  You must follow the instructions below carefully.  If you have questions or problems, please call and speak to someone in the clinic department at our office:   [phone].     INSTRUCTIONS: 1. Five days prior to your procedure do not eat nuts, popcorn, or fruit with seeds.  Stop all fiber supplements such as Metamucil, Citrucel, etc. 2. Two days before surgery fill the prescription at a pharmacy of your choice and purchase the additional supplies below.         MIRALAX - GATORADE -- DULCOLAX TABS -- FLEET ENEMA:   Purchase a bottle of MIRALAX  (255 gm bottle)    In addition, purchase four (4) DULCOLAX TABLETS (no prescription required- ask the pharmacist if you can't find them)   Purchase one Fleet Enema (Green and white box)    Purchase one 64 oz GATORADE.  (Do NOT purchase red Gatorade; any other flavor is acceptable) and place in refrigerator to get cold.  3.   Day Before Surgery:   6 am: Wash you abdomen with soap and repeat this on the morning of surgery and take 4 Dulcolax tablets   You may only have clear liquids (tea, coffee, juice, broth, jello, soft drinks, gummy bears).  You cannot have solid foods, cream, milk or milk products.  Drink at lease 8 ounces of liquids every hour while awake.   Take the Flagyl prescription as directed at 8 am, 2 pm and 8 pm.  It is helpful to take this with some jello instead of on an empty stomach.  Any flavor is ok, except red jello, which will cause red stools.   Mix the entire bottle of MiraLax and the Gatorade in a large container.    10:00am: Begin drinking the Gatorade mixture until gone (8 oz every 15-30 minutes).      You may suck on a lime wedge or hard candy to "freshen your palate" in between glasses   If you are a diabetic, take your blood sugar reading several time  throughout the prep.  Have some juice available to take if your sugar level gets too low   You may feel chilled while taking the prep.  Have some warm tea or broth to help warm up.   Continue clear liquids until midnight or bedtime  3. The day of your procedure:   Do not eat or drink ANYTHING after midnight before your surgery.     If you take Heart or Blood Pressure medicine, ask the pre-op nurses about these during your preop appointment.   Take a regular Fleet Enema one hour before leaving the come to the hospital.  Try to hold it in 5-10 mins before expelling.   Further pre-operative instructions will be given to you from the hospital.   Expect to be contacted 5-7 days before your surgery.

## 2013-11-30 ENCOUNTER — Encounter (HOSPITAL_COMMUNITY): Payer: Self-pay | Admitting: Pharmacy Technician

## 2013-12-02 ENCOUNTER — Other Ambulatory Visit (INDEPENDENT_AMBULATORY_CARE_PROVIDER_SITE_OTHER): Payer: Self-pay | Admitting: General Surgery

## 2013-12-02 ENCOUNTER — Telehealth (INDEPENDENT_AMBULATORY_CARE_PROVIDER_SITE_OTHER): Payer: Self-pay | Admitting: General Surgery

## 2013-12-02 ENCOUNTER — Encounter (HOSPITAL_COMMUNITY)
Admission: RE | Admit: 2013-12-02 | Discharge: 2013-12-02 | Disposition: A | Discharge status: Home / Self Care | Payer: Medicaid Other | Source: Ambulatory Visit | Attending: General Surgery | Admitting: General Surgery

## 2013-12-02 ENCOUNTER — Encounter (HOSPITAL_COMMUNITY): Payer: Self-pay

## 2013-12-02 DIAGNOSIS — Z01818 Encounter for other preprocedural examination: Secondary | ICD-10-CM

## 2013-12-02 DIAGNOSIS — Z01812 Encounter for preprocedural laboratory examination: Secondary | ICD-10-CM | POA: Insufficient documentation

## 2013-12-02 LAB — CBC
HCT: 38.3 % (ref 36.0–46.0)
HEMOGLOBIN: 13.6 g/dL (ref 12.0–15.0)
MCH: 30.9 pg (ref 26.0–34.0)
MCHC: 35.5 g/dL (ref 30.0–36.0)
MCV: 87 fL (ref 78.0–100.0)
Platelets: 344 10*3/uL (ref 150–400)
RBC: 4.4 MIL/uL (ref 3.87–5.11)
RDW: 14.9 % (ref 11.5–15.5)
WBC: 6.6 10*3/uL (ref 4.0–10.5)

## 2013-12-02 LAB — BASIC METABOLIC PANEL
BUN: 12 mg/dL (ref 6–23)
CALCIUM: 9.4 mg/dL (ref 8.4–10.5)
CO2: 21 mEq/L (ref 19–32)
CREATININE: 0.7 mg/dL (ref 0.50–1.10)
Chloride: 105 mEq/L (ref 96–112)
GFR calc non Af Amer: >90 mL/min (ref >90)
Glucose, Bld: 99 mg/dL (ref 70–99)
Potassium: 5.1 mEq/L (ref 3.7–5.3)
Sodium: 138 mEq/L (ref 137–147)

## 2013-12-02 LAB — ABO/RH: ABO/RH(D): O POS

## 2013-12-02 LAB — HCG, SERUM, QUALITATIVE: Preg, Serum: NEGATIVE

## 2013-12-02 MED ORDER — METRONIDAZOLE 500 MG PO TABS: 500.0000 mg | ORAL_TABLET | Freq: Three times a day (TID) | ORAL | Status: DC

## 2013-12-02 NOTE — Telephone Encounter (Signed)
Pt called to ask about the prescription for Flagyl.  Her pharmacy does not have the Rx.  Asking if she still needs it as it appears on her pre-op instructions given to her at her appt with Dr. Maisie Fushomas.  Verified pharmacy in demographics is correct.

## 2013-12-02 NOTE — Patient Instructions (Addendum)
19 Heather [name]  12/02/2013   Your procedure is scheduled on:   3-31--2015  Report to [facility] at    1045    AM.  Call this number if you have problems the morning of surgery: [phone]  Or Presurgical Testing [phone]([place]) For Living Will and/or Health Care Power Attorney Forms: please provide copy for your medical record,may bring AM of surgery(Forms should be already notarized -we do not provide this service).(12-02-13 Patient desires no further information today).  Remember: Follow any bowel prep instructions per MD office. For Cpap use: Bring mask and tubing only.   Do not eat food:After Midnight.  May have clear liquids:up to 6 Hours before arrival. Nothing after : 0700 AM  Clear liquids include soda, tea, black coffee, apple or grape juice, broth.  Take these medicines the morning of surgery with A SIP OF WATER: none-Tylenol okay if needed   Do not wear jewelry, make-up or nail polish.  Do not wear lotions, powders, or perfumes. You may wear deodorant.  Do not shave 48 hours(2 days) prior to first CHG shower(legs and under arms).(Shaving face and neck okay.)  Do not bring valuables to the hospital.(Hospital is not responsible for lost valuables).  Contacts, dentures or removable bridgework, body piercing, hair pins may not be worn into surgery.  Leave suitcase in the car. After surgery it may be brought to your room.  For patients admitted to the hospital, checkout time is 11:00 AM the day of discharge.(Restricted visitors-Any Persons displaying flu-like symptoms or illness).    Patients discharged the day of surgery will not be allowed to drive home. Must have responsible person with you x 24 hours once discharged.  Name and phone number of your driver: Lewayne BuntingShawn Falden,spouse [phone] cell  Special Instructions: CHG(Chlorhedine 4%-"Hibiclens","Betasept","Aplicare") Shower Use Special Wash: see special instructions.(avoid face and genitals)   Please read  over the following fact sheets that you were given:  Blood Transfusion fact sheet, Incentive Spirometry Instruction.  Remember : Type/Screen "Blue armbands" - may not be removed once applied(would result in being retested AM of surgery, if removed).  Failure to follow these instructions may result in Cancellation of your surgery.   Patient signature_______________________________________________________

## 2013-12-08 ENCOUNTER — Inpatient Hospital Stay (HOSPITAL_COMMUNITY)
Admission: RE | Admit: 2013-12-08 | Discharge: 2013-12-12 | DRG: 330 | Disposition: A | Discharge status: Home / Self Care | Payer: Medicaid Other | Source: Ambulatory Visit | Attending: General Surgery | Admitting: General Surgery

## 2013-12-08 ENCOUNTER — Encounter (HOSPITAL_COMMUNITY): Payer: Medicaid Other | Admitting: Certified Registered Nurse Anesthetist

## 2013-12-08 ENCOUNTER — Ambulatory Visit (HOSPITAL_COMMUNITY): Payer: Medicaid Other | Admitting: Certified Registered Nurse Anesthetist

## 2013-12-08 ENCOUNTER — Encounter (HOSPITAL_COMMUNITY): Payer: Self-pay | Admitting: *Deleted

## 2013-12-08 ENCOUNTER — Encounter (HOSPITAL_COMMUNITY)
Admission: RE | Disposition: A | Discharge status: Home / Self Care | Payer: Self-pay | Source: Ambulatory Visit | Attending: General Surgery

## 2013-12-08 DIAGNOSIS — Z79899 Other long term (current) drug therapy: Secondary | ICD-10-CM

## 2013-12-08 DIAGNOSIS — F17200 Nicotine dependence, unspecified, uncomplicated: Secondary | ICD-10-CM | POA: Diagnosis present

## 2013-12-08 DIAGNOSIS — D62 Acute posthemorrhagic anemia: Secondary | ICD-10-CM | POA: Diagnosis not present

## 2013-12-08 DIAGNOSIS — Z01812 Encounter for preprocedural laboratory examination: Secondary | ICD-10-CM

## 2013-12-08 DIAGNOSIS — K5790 Diverticulosis of intestine, part unspecified, without perforation or abscess without bleeding: Secondary | ICD-10-CM | POA: Diagnosis present

## 2013-12-08 DIAGNOSIS — N736 Female pelvic peritoneal adhesions (postinfective): Secondary | ICD-10-CM | POA: Diagnosis present

## 2013-12-08 DIAGNOSIS — K5730 Diverticulosis of large intestine without perforation or abscess without bleeding: Secondary | ICD-10-CM

## 2013-12-08 DIAGNOSIS — N731 Chronic parametritis and pelvic cellulitis: Secondary | ICD-10-CM | POA: Diagnosis present

## 2013-12-08 DIAGNOSIS — K5732 Diverticulitis of large intestine without perforation or abscess without bleeding: Principal | ICD-10-CM | POA: Diagnosis present

## 2013-12-08 DIAGNOSIS — R110 Nausea: Secondary | ICD-10-CM | POA: Diagnosis not present

## 2013-12-08 DIAGNOSIS — I959 Hypotension, unspecified: Secondary | ICD-10-CM | POA: Diagnosis not present

## 2013-12-08 LAB — TYPE AND SCREEN
ABO/RH(D): O POS
Antibody Screen: NEGATIVE

## 2013-12-08 SURGERY — ROBOT ASSISTED LAPAROSCOPIC PARTIAL COLECTOMY
Anesthesia: General

## 2013-12-08 MED ORDER — MORPHINE SULFATE (PF) 1 MG/ML IV SOLN
INTRAVENOUS | Status: AC
  Filled 2013-12-08: qty 25

## 2013-12-08 MED ORDER — PROPOFOL 10 MG/ML IV BOLUS
INTRAVENOUS | Status: DC | PRN
  Administered 2013-12-08: 200 mg via INTRAVENOUS

## 2013-12-08 MED ORDER — LABETALOL HCL 5 MG/ML IV SOLN
INTRAVENOUS | Status: DC | PRN
  Administered 2013-12-08 (×2): 2.5 mg via INTRAVENOUS

## 2013-12-08 MED ORDER — GLYCOPYRROLATE 0.2 MG/ML IJ SOLN
INTRAMUSCULAR | Status: AC
  Filled 2013-12-08: qty 1

## 2013-12-08 MED ORDER — NALOXONE HCL 0.4 MG/ML IJ SOLN: 0.4000 mg | INTRAMUSCULAR | Status: DC | PRN

## 2013-12-08 MED ORDER — HYDROMORPHONE HCL PF 1 MG/ML IJ SOLN
INTRAMUSCULAR | Status: AC
  Filled 2013-12-08: qty 1

## 2013-12-08 MED ORDER — DIPHENHYDRAMINE HCL 50 MG/ML IJ SOLN
12.5000 mg | Freq: Four times a day (QID) | INTRAMUSCULAR | Status: DC | PRN
  Administered 2013-12-09 – 2013-12-10 (×4): 12.5 mg via INTRAVENOUS
  Filled 2013-12-08 (×4): qty 1

## 2013-12-08 MED ORDER — LACTATED RINGERS IV SOLN
INTRAVENOUS | Status: DC
  Administered 2013-12-08: 1000 mL via INTRAVENOUS

## 2013-12-08 MED ORDER — SODIUM CHLORIDE 0.9 % IJ SOLN: 9.0000 mL | INTRAMUSCULAR | Status: DC | PRN

## 2013-12-08 MED ORDER — GLYCOPYRROLATE 0.2 MG/ML IJ SOLN
INTRAMUSCULAR | Status: AC
  Filled 2013-12-08: qty 4

## 2013-12-08 MED ORDER — DIPHENHYDRAMINE HCL 50 MG/ML IJ SOLN: 12.5000 mg | Freq: Four times a day (QID) | INTRAMUSCULAR | Status: DC | PRN

## 2013-12-08 MED ORDER — LABETALOL HCL 5 MG/ML IV SOLN
INTRAVENOUS | Status: AC
  Filled 2013-12-08: qty 4

## 2013-12-08 MED ORDER — MIDAZOLAM HCL 2 MG/2ML IJ SOLN
INTRAMUSCULAR | Status: AC
Start: 2013-12-08 — End: 2013-12-08
  Filled 2013-12-08: qty 2

## 2013-12-08 MED ORDER — MIDAZOLAM HCL 2 MG/2ML IJ SOLN
INTRAMUSCULAR | Status: AC
  Filled 2013-12-08: qty 2

## 2013-12-08 MED ORDER — ROCURONIUM BROMIDE 100 MG/10ML IV SOLN
INTRAVENOUS | Status: AC
Start: 2013-12-08 — End: 2013-12-08
  Filled 2013-12-08: qty 1

## 2013-12-08 MED ORDER — DIPHENHYDRAMINE HCL 12.5 MG/5ML PO ELIX: 12.5000 mg | ORAL_SOLUTION | Freq: Four times a day (QID) | ORAL | Status: DC | PRN

## 2013-12-08 MED ORDER — NEOSTIGMINE METHYLSULFATE 1 MG/ML IJ SOLN
INTRAMUSCULAR | Status: AC
  Filled 2013-12-08: qty 10

## 2013-12-08 MED ORDER — ONDANSETRON HCL 4 MG/2ML IJ SOLN: 4.0000 mg | Freq: Four times a day (QID) | INTRAMUSCULAR | Status: DC | PRN

## 2013-12-08 MED ORDER — FENTANYL CITRATE 0.05 MG/ML IJ SOLN
INTRAMUSCULAR | Status: DC | PRN
  Administered 2013-12-08: 25 ug via INTRAVENOUS
  Administered 2013-12-08 (×6): 50 ug via INTRAVENOUS
  Administered 2013-12-08: 25 ug via INTRAVENOUS

## 2013-12-08 MED ORDER — HYDROMORPHONE HCL PF 1 MG/ML IJ SOLN
0.2500 mg | INTRAMUSCULAR | Status: DC | PRN
  Administered 2013-12-08 (×4): 0.5 mg via INTRAVENOUS

## 2013-12-08 MED ORDER — GLYCOPYRROLATE 0.2 MG/ML IJ SOLN
INTRAMUSCULAR | Status: DC | PRN
  Administered 2013-12-08: 0.2 mg via INTRAVENOUS
  Administered 2013-12-08: .6 mg via INTRAVENOUS

## 2013-12-08 MED ORDER — 0.9 % SODIUM CHLORIDE (POUR BTL) OPTIME
TOPICAL | Status: DC | PRN
  Administered 2013-12-08: 800 mL
  Administered 2013-12-08: 200 mL

## 2013-12-08 MED ORDER — ONDANSETRON HCL 4 MG/2ML IJ SOLN
4.0000 mg | Freq: Four times a day (QID) | INTRAMUSCULAR | Status: DC | PRN
  Administered 2013-12-09: 4 mg via INTRAVENOUS
  Filled 2013-12-08: qty 2

## 2013-12-08 MED ORDER — DEXAMETHASONE SODIUM PHOSPHATE 10 MG/ML IJ SOLN
INTRAMUSCULAR | Status: DC | PRN
  Administered 2013-12-08: 10 mg via INTRAVENOUS

## 2013-12-08 MED ORDER — HEPARIN SODIUM (PORCINE) 5000 UNIT/ML IJ SOLN
5000.0000 [IU] | Freq: Once | INTRAMUSCULAR | Status: AC
  Administered 2013-12-08: 5000 [IU] via SUBCUTANEOUS
  Filled 2013-12-08: qty 1

## 2013-12-08 MED ORDER — ALVIMOPAN 12 MG PO CAPS
12.0000 mg | ORAL_CAPSULE | Freq: Once | ORAL | Status: AC
  Administered 2013-12-08: 12 mg via ORAL
  Filled 2013-12-08: qty 1

## 2013-12-08 MED ORDER — NEOSTIGMINE METHYLSULFATE 1 MG/ML IJ SOLN
INTRAMUSCULAR | Status: DC | PRN
  Administered 2013-12-08: 4 mg via INTRAVENOUS

## 2013-12-08 MED ORDER — DEXTROSE 5 % IV SOLN
2.0000 g | INTRAVENOUS | Status: AC
  Administered 2013-12-08: 2 g via INTRAVENOUS
  Filled 2013-12-08: qty 2

## 2013-12-08 MED ORDER — SUCCINYLCHOLINE CHLORIDE 20 MG/ML IJ SOLN
INTRAMUSCULAR | Status: DC | PRN
  Administered 2013-12-08: 100 mg via INTRAVENOUS

## 2013-12-08 MED ORDER — BUPIVACAINE-EPINEPHRINE PF 0.25-1:200000 % IJ SOLN
INTRAMUSCULAR | Status: AC
  Filled 2013-12-08: qty 30

## 2013-12-08 MED ORDER — ONDANSETRON HCL 4 MG/2ML IJ SOLN
INTRAMUSCULAR | Status: DC | PRN
  Administered 2013-12-08: 4 mg via INTRAVENOUS

## 2013-12-08 MED ORDER — PROPOFOL 10 MG/ML IV BOLUS
INTRAVENOUS | Status: AC
  Filled 2013-12-08: qty 20

## 2013-12-08 MED ORDER — HYDROMORPHONE HCL PF 1 MG/ML IJ SOLN
INTRAMUSCULAR | Status: DC | PRN
  Administered 2013-12-08 (×4): 0.5 mg via INTRAVENOUS

## 2013-12-08 MED ORDER — CEFOTETAN DISODIUM-DEXTROSE 2-2.08 GM-% IV SOLR
INTRAVENOUS | Status: AC
  Filled 2013-12-08: qty 50

## 2013-12-08 MED ORDER — ENOXAPARIN SODIUM 40 MG/0.4ML ~~LOC~~ SOLN
40.0000 mg | SUBCUTANEOUS | Status: DC
  Administered 2013-12-09 – 2013-12-11 (×3): 40 mg via SUBCUTANEOUS
  Filled 2013-12-08 (×4): qty 0.4

## 2013-12-08 MED ORDER — ONDANSETRON HCL 4 MG/2ML IJ SOLN
INTRAMUSCULAR | Status: AC
  Filled 2013-12-08: qty 2

## 2013-12-08 MED ORDER — ROCURONIUM BROMIDE 100 MG/10ML IV SOLN
INTRAVENOUS | Status: DC | PRN
  Administered 2013-12-08: 50 mg via INTRAVENOUS
  Administered 2013-12-08: 20 mg via INTRAVENOUS
  Administered 2013-12-08: 10 mg via INTRAVENOUS
  Administered 2013-12-08: 20 mg via INTRAVENOUS
  Administered 2013-12-08: 5 mg via INTRAVENOUS

## 2013-12-08 MED ORDER — MIDAZOLAM HCL 2 MG/2ML IJ SOLN
1.0000 mg | INTRAMUSCULAR | Status: DC | PRN
  Administered 2013-12-08 (×2): 1 mg via INTRAVENOUS

## 2013-12-08 MED ORDER — LACTATED RINGERS IV SOLN
INTRAVENOUS | Status: DC
  Administered 2013-12-09: 05:00:00 via INTRAVENOUS

## 2013-12-08 MED ORDER — BUPIVACAINE-EPINEPHRINE 0.25% -1:200000 IJ SOLN
INTRAMUSCULAR | Status: DC | PRN
  Administered 2013-12-08: 15 mL

## 2013-12-08 MED ORDER — FENTANYL CITRATE 0.05 MG/ML IJ SOLN
INTRAMUSCULAR | Status: AC
  Filled 2013-12-08: qty 2

## 2013-12-08 MED ORDER — FENTANYL CITRATE 0.05 MG/ML IJ SOLN
INTRAMUSCULAR | Status: AC
  Filled 2013-12-08: qty 5

## 2013-12-08 MED ORDER — LIDOCAINE HCL (CARDIAC) 20 MG/ML IV SOLN
INTRAVENOUS | Status: DC | PRN
  Administered 2013-12-08: 100 mg via INTRAVENOUS

## 2013-12-08 MED ORDER — HYDROMORPHONE 0.3 MG/ML IV SOLN
INTRAVENOUS | Status: DC
  Administered 2013-12-08: 23:00:00 via INTRAVENOUS
  Administered 2013-12-09: 2.6 mg via INTRAVENOUS
  Administered 2013-12-09: 3.3 mg via INTRAVENOUS
  Administered 2013-12-09: 2.666 mg via INTRAVENOUS
  Administered 2013-12-09: 1.2 mg via INTRAVENOUS
  Administered 2013-12-09: 11:00:00 via INTRAVENOUS
  Administered 2013-12-09: 1.2 mg via INTRAVENOUS
  Administered 2013-12-10: 1.5 mg via INTRAVENOUS
  Administered 2013-12-10: 2.4 mg via INTRAVENOUS
  Administered 2013-12-10: 12:00:00 via INTRAVENOUS
  Administered 2013-12-10: 0.6 mg via INTRAVENOUS
  Administered 2013-12-10: 1.2 mg via INTRAVENOUS
  Administered 2013-12-10: 0.6 mg via INTRAVENOUS
  Administered 2013-12-10: 0.9 mg via INTRAVENOUS
  Administered 2013-12-10: 2.1 mg via INTRAVENOUS
  Administered 2013-12-11: 3.6 mg via INTRAVENOUS
  Administered 2013-12-11: 1.2 mg via INTRAVENOUS
  Administered 2013-12-11: 2.1 mg via INTRAVENOUS
  Filled 2013-12-08 (×4): qty 25

## 2013-12-08 MED ORDER — GLYCOPYRROLATE 0.2 MG/ML IJ SOLN
INTRAMUSCULAR | Status: AC
  Filled 2013-12-08: qty 3

## 2013-12-08 MED ORDER — MIDAZOLAM HCL 2 MG/2ML IJ SOLN: 2.0000 mg | Freq: Once | INTRAMUSCULAR | Status: DC | PRN

## 2013-12-08 MED ORDER — ROCURONIUM BROMIDE 100 MG/10ML IV SOLN
INTRAVENOUS | Status: AC
  Filled 2013-12-08: qty 1

## 2013-12-08 MED ORDER — DEXTROSE 5 % IV SOLN
2.0000 g | Freq: Two times a day (BID) | INTRAVENOUS | Status: AC
  Administered 2013-12-09: 2 g via INTRAVENOUS
  Filled 2013-12-08: qty 2

## 2013-12-08 MED ORDER — MORPHINE SULFATE (PF) 1 MG/ML IV SOLN
INTRAVENOUS | Status: DC
  Administered 2013-12-08: 20:00:00 via INTRAVENOUS

## 2013-12-08 MED ORDER — MIDAZOLAM HCL 5 MG/5ML IJ SOLN
INTRAMUSCULAR | Status: DC | PRN
  Administered 2013-12-08 (×2): 1 mg via INTRAVENOUS

## 2013-12-08 MED ORDER — HYDROMORPHONE HCL PF 2 MG/ML IJ SOLN
INTRAMUSCULAR | Status: AC
  Filled 2013-12-08: qty 1

## 2013-12-08 MED ORDER — LACTATED RINGERS IV SOLN: INTRAVENOUS | Status: DC

## 2013-12-08 MED ORDER — LIDOCAINE HCL (CARDIAC) 20 MG/ML IV SOLN
INTRAVENOUS | Status: AC
Start: 2013-12-08 — End: 2013-12-08
  Filled 2013-12-08: qty 5

## 2013-12-08 SURGICAL SUPPLY — 87 items
BLADE EXTENDED COATED 6.5IN (ELECTRODE) ×3 IMPLANT
BLADE HEX COATED 2.75 (ELECTRODE) ×6 IMPLANT
BLADE SURG SZ10 CARB STEEL (BLADE) ×3 IMPLANT
CANISTER SUCTION 2500CC (MISCELLANEOUS) IMPLANT
CELLS DAT CNTRL 66122 CELL SVR (MISCELLANEOUS) ×1 IMPLANT
CLAMP CORD UMBIL (MISCELLANEOUS) IMPLANT
CLIP LIGATING HEM O LOK PURPLE (MISCELLANEOUS) ×12 IMPLANT
CLIP LIGATING HEMOLOK MED (MISCELLANEOUS) ×3 IMPLANT
COUNTER NEEDLE 20 DBL MAG RED (NEEDLE) ×3 IMPLANT
COVER MAYO STAND STRL (DRAPES) ×6 IMPLANT
COVER TIP SHEARS 8 DVNC (MISCELLANEOUS) ×1 IMPLANT
COVER TIP SHEARS 8MM DA VINCI (MISCELLANEOUS) ×2
DECANTER SPIKE VIAL GLASS SM (MISCELLANEOUS) IMPLANT
DRAIN CHANNEL 19F RND (DRAIN) IMPLANT
DRAPE INSTRUMENT ARM DA VINCI (DRAPES) ×8
DRAPE INSTRUMENT ARM DVNC (DRAPES) ×4 IMPLANT
DRAPE LAPAROSCOPIC ABDOMINAL (DRAPES) IMPLANT
DRAPE LG THREE QUARTER DISP (DRAPES) ×9 IMPLANT
DRAPE UTILITY XL STRL (DRAPES) ×6 IMPLANT
DRAPE WARM FLUID 44X44 (DRAPE) ×3 IMPLANT
DRSG OPSITE POSTOP 4X10 (GAUZE/BANDAGES/DRESSINGS) IMPLANT
DRSG OPSITE POSTOP 4X6 (GAUZE/BANDAGES/DRESSINGS) ×3 IMPLANT
DRSG OPSITE POSTOP 4X8 (GAUZE/BANDAGES/DRESSINGS) IMPLANT
DRSG TEGADERM 2-3/8X2-3/4 SM (GAUZE/BANDAGES/DRESSINGS) ×6 IMPLANT
DRSG TEGADERM 4X4.75 (GAUZE/BANDAGES/DRESSINGS) ×3 IMPLANT
DRSG TELFA PLUS 4X6 ADH ISLAND (GAUZE/BANDAGES/DRESSINGS) ×3 IMPLANT
ELECT REM PT RETURN 9FT ADLT (ELECTROSURGICAL) ×3
ELECTRODE REM PT RTRN 9FT ADLT (ELECTROSURGICAL) ×1 IMPLANT
ENDOLOOP SUT PDS II  0 18 (SUTURE)
ENDOLOOP SUT PDS II 0 18 (SUTURE) IMPLANT
GLOVE ECLIPSE 8.0 STRL XLNG CF (GLOVE) IMPLANT
GLOVE INDICATOR 8.0 STRL GRN (GLOVE) IMPLANT
GOWN STRL REUS W/TWL XL LVL3 (GOWN DISPOSABLE) ×6 IMPLANT
KIT BASIN OR (CUSTOM PROCEDURE TRAY) ×3 IMPLANT
LEGGING LITHOTOMY PAIR STRL (DRAPES) IMPLANT
MANIFOLD NEPTUNE II (INSTRUMENTS) ×3 IMPLANT
NEEDLE INSUFFLATION 14GA 120MM (NEEDLE) ×3 IMPLANT
PACK CARDIOVASCULAR III (CUSTOM PROCEDURE TRAY) ×3 IMPLANT
PACK GENERAL/GYN (CUSTOM PROCEDURE TRAY) ×3 IMPLANT
PENCIL BUTTON HOLSTER BLD 10FT (ELECTRODE) ×6 IMPLANT
PUMP PAIN ON-Q (MISCELLANEOUS) IMPLANT
RTRCTR WOUND ALEXIS 18CM MED (MISCELLANEOUS) ×3
SCISSORS LAP 5X35 DISP (ENDOMECHANICALS) ×3 IMPLANT
SEAL CANN UNIV 5-8 DVNC XI (MISCELLANEOUS) ×4 IMPLANT
SEAL XI 5MM-8MM UNIVERSAL (MISCELLANEOUS) ×8
SEALER TISSUE G2 STRG ARTC 35C (ENDOMECHANICALS) IMPLANT
SET IRRIG TUBING LAPAROSCOPIC (IRRIGATION / IRRIGATOR) ×3 IMPLANT
SLEEVE XCEL OPT CAN 5 100 (ENDOMECHANICALS) IMPLANT
SOLUTION ELECTROLUBE (MISCELLANEOUS) ×3 IMPLANT
SPONGE GAUZE 4X4 12PLY (GAUZE/BANDAGES/DRESSINGS) ×3 IMPLANT
SPONGE LAP 18X18 X RAY DECT (DISPOSABLE) ×6 IMPLANT
STAPLER CANNULA SEAL DVNC XI (STAPLE) ×1 IMPLANT
STAPLER CANNULA SEAL XI (STAPLE) ×2
STAPLER CIRC CVD 29MM 37CM (STAPLE) ×3 IMPLANT
STAPLER CUT CVD 40MM BLUE (STAPLE) ×3 IMPLANT
STAPLER CUT RELOAD BLUE (STAPLE) ×3 IMPLANT
STAPLER VISISTAT 35W (STAPLE) ×3 IMPLANT
SUCTION POOLE TIP (SUCTIONS) IMPLANT
SUT MNCRL AB 4-0 PS2 18 (SUTURE) ×3 IMPLANT
SUT NOVA NAB DX-16 0-1 5-0 T12 (SUTURE) ×6 IMPLANT
SUT PDS AB 1 CTX 36 (SUTURE) IMPLANT
SUT PDS AB 1 TP1 96 (SUTURE) IMPLANT
SUT PROLENE 0 CT 2 (SUTURE) ×3 IMPLANT
SUT PROLENE 3 0 KS (SUTURE) ×3 IMPLANT
SUT SILK 2 0 (SUTURE) ×2
SUT SILK 2 0 SH CR/8 (SUTURE) ×3 IMPLANT
SUT SILK 2-0 18XBRD TIE 12 (SUTURE) ×1 IMPLANT
SUT SILK 3 0 (SUTURE)
SUT SILK 3 0 SH CR/8 (SUTURE) ×3 IMPLANT
SUT SILK 3-0 18XBRD TIE 12 (SUTURE) IMPLANT
SUT VIC AB 2-0 CT1 27 (SUTURE) ×2
SUT VIC AB 2-0 CT1 TAPERPNT 27 (SUTURE) ×1 IMPLANT
SUT VIC AB 2-0 SH 18 (SUTURE) ×3 IMPLANT
SYR BULB IRRIGATION 50ML (SYRINGE) ×3 IMPLANT
SYS LAPSCP GELPORT 120MM (MISCELLANEOUS)
SYSTEM LAPSCP GELPORT 120MM (MISCELLANEOUS) IMPLANT
TAPE UMBILICAL COTTON 1/8X30 (MISCELLANEOUS) IMPLANT
TOWEL OR 17X26 10 PK STRL BLUE (TOWEL DISPOSABLE) ×6 IMPLANT
TOWEL OR NON WOVEN STRL DISP B (DISPOSABLE) ×6 IMPLANT
TRAY FOLEY CATH 14FRSI W/METER (CATHETERS) ×3 IMPLANT
TROCAR BLADELESS OPT 5 100 (ENDOMECHANICALS) ×3 IMPLANT
TUBING CONNECTING 10 (TUBING) IMPLANT
TUBING CONNECTING 10' (TUBING)
TUBING FILTER THERMOFLATOR (ELECTROSURGICAL) ×3 IMPLANT
TUNNELER SHEATH ON-Q 16GX12 DP (PAIN MANAGEMENT) IMPLANT
WATER STERILE IRR 1000ML POUR (IV SOLUTION) ×3 IMPLANT
YANKAUER SUCT BULB TIP 10FT TU (MISCELLANEOUS) ×3 IMPLANT

## 2013-12-08 NOTE — Anesthesia Postprocedure Evaluation (Signed)
  Anesthesia Post-op Note  Patient: Heather CopaDenise Arvizu  Procedure(s) Performed: Procedure(s) (LRB): ROBOT ASSISTED LAPAROSCOPIC PARTIAL COLECTOMY (N/A)  Patient Location: PACU  Anesthesia Type: General  Level of Consciousness: awake and alert   Airway and Oxygen Therapy: Patient Spontanous Breathing  Post-op Pain: mild  Post-op Assessment: Post-op Vital signs reviewed, Patient's Cardiovascular Status Stable, Respiratory Function Stable, Patent Airway and No signs of Nausea or vomiting  Last Vitals:  Filed Vitals:   12/08/13 2057  BP: 144/81  Pulse: 60  Temp: 36.4 C  Resp: 14    Post-op Vital Signs: stable   Complications: No apparent anesthesia complications

## 2013-12-08 NOTE — Progress Notes (Signed)
Have seen minimal improvement in anxiety, versed repeated.

## 2013-12-08 NOTE — Anesthesia Preprocedure Evaluation (Addendum)
Anesthesia Evaluation  Patient identified by MRN, date of birth, ID band Patient awake    Reviewed: Allergy & Precautions, H&P , NPO status , Patient's Chart, lab work & pertinent test results  Airway Mallampati: II TM Distance: >3 FB Neck ROM: full    Dental no notable dental hx.    Pulmonary neg pulmonary ROS, Current Smoker,  breath sounds clear to auscultation  Pulmonary exam normal       Cardiovascular Exercise Tolerance: Good negative cardio ROS  Rhythm:regular Rate:Normal     Neuro/Psych negative neurological ROS  negative psych ROS   GI/Hepatic negative GI ROS, Neg liver ROS,   Endo/Other  negative endocrine ROS  Renal/GU negative Renal ROS  negative genitourinary   Musculoskeletal   Abdominal   Peds  Hematology negative hematology ROS (+)   Anesthesia Other Findings   Reproductive/Obstetrics negative OB ROS                          Anesthesia Physical Anesthesia Plan  ASA: II  Anesthesia Plan: General   Post-op Pain Management:    Induction: Intravenous  Airway Management Planned: Oral ETT  Additional Equipment:   Intra-op Plan:   Post-operative Plan: Extubation in OR  Informed Consent: I have reviewed the patients History and Physical, chart, labs and discussed the procedure including the risks, benefits and alternatives for the proposed anesthesia with the patient or authorized representative who has indicated his/her understanding and acceptance.   Dental Advisory Given  Plan Discussed with: CRNA and Surgeon  Anesthesia Plan Comments:         Anesthesia Quick Evaluation

## 2013-12-08 NOTE — Progress Notes (Signed)
Is extremely anxious-moaning and rolling about in bed, at times beating on the side rails. Order obtained for versed and given.

## 2013-12-08 NOTE — Brief Op Note (Signed)
12/08/2013  7:02 PM  PATIENT:  Heather [name]  43 y.o. female  PRE-OPERATIVE DIAGNOSIS:  diverticular disease   POST-OPERATIVE DIAGNOSIS:  diverticular disease  PROCEDURE:  ROBOT ASSISTED PARTIAL COLECTOMY (N/A)  SURGEON:  Surgeon(s) and Role:    * Romie LeveeAlicia Rechelle Niebla, MD - Primary    * Axel FillerArmando Ramirez, MD - Assisting    ASSISTANTS: Derrell Lollingamirez   ANESTHESIA:   general  EBL:     BLOOD ADMINISTERED:none  DRAINS: none   LOCAL MEDICATIONS USED:  MARCAINE     SPECIMEN:  Source of Specimen:  sigmoid colon  DISPOSITION OF SPECIMEN:  PATHOLOGY  COUNTS:  YES  TOURNIQUET:  * No tourniquets in log *  DICTATION: .Dragon Dictation  PLAN OF CARE: Admit to inpatient   PATIENT DISPOSITION:  PACU - hemodynamically stable.   Delay start of Pharmacological VTE agent (>24hrs) due to surgical blood loss or risk of bleeding: no

## 2013-12-08 NOTE — H&P (Signed)
Heather [name] is a 43 y.o. female who is here for a follow up visit regarding her diverticular disease. She was recently hospitalized for another episode in Jan. She has completed her antibiotics and her pain is better. She is having regular BM's. She occasionally has a pain in her lower pelvis. She denies any bleeding.   Past Medical History  Diagnosis Date  . Diverticulitis    Past Surgical History  Procedure Laterality Date  . Cesarean section    . No past surgeries    . Abdominal surgery      to place drainage tube for abscess/removed 10-31-12   Family History  Problem Relation Age of Onset  . Skin cancer Sister   . Breast cancer Sister    History   Social History  . Marital Status: Single    Spouse Name: N/A    Number of Children: N/A  . Years of Education: N/A   Occupational History  . Not on file.   Social History Main Topics  . Smoking status: Current Every Day Smoker -- 1.00 packs/day for 15 years    Types: Cigarettes  . Smokeless tobacco: Not on file     Comment: considering  . Alcohol Use: Yes  . Drug Use: Yes    Special: Marijuana  . Sexual Activity: Yes    Birth Control/ Protection: None   Other Topics Concern  . Not on file   Social History Narrative  . No narrative on file     Medication List           docusate sodium 100 MG capsule  Commonly known as:  COLACE  Take 100 mg by mouth daily as needed for mild constipation or moderate constipation.     ibuprofen 200 MG tablet  Commonly known as:  ADVIL,MOTRIN  Take 600 mg by mouth every 6 (six) hours as needed for mild pain or moderate pain.     metroNIDAZOLE 500 MG tablet  Commonly known as:  FLAGYL  Take 1 tablet (500 mg total) by mouth 3 (three) times daily.       No Known Allergies  Review of Systems  Constitutional: Negative for fever and chills.  Eyes: Negative for blurred vision.  Respiratory: Negative for cough, sputum production and shortness of breath.   Cardiovascular:  Negative for chest pain.  Gastrointestinal: Negative for nausea, vomiting, abdominal pain, constipation and blood in stool.  Genitourinary: Negative for dysuria, urgency and frequency.  Musculoskeletal: Negative for myalgias.  Neurological: Negative for dizziness and headaches.    Objective:  Filed Vitals:   12/08/13 0931  BP: 119/78  Pulse: 62  Temp: 98.6 F (37 C)  Resp: 18   General appearance: alert and cooperative  RRR CTA GI: soft, non-tender  Ext: no edema, good ROM  Assessment and Plan:  Heather [name] is a 43 y.o. 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 We once again discussed surgery, and I believe she is ready to proceed. The surgery and anatomy were described to the patient as well as the risks of surgery and the possible complications. Given her anatomy on CT scan, I do not think it is possible to get a colonoscopy prior to surgery. We will try to perform this after surgery if needed. She is not of screening age for colon cancer. She will also need marking of her ureters. If we cannot do the surgery robotically due to scheduling, we will need urology to place bilateral ureteral stents prior to surgery. We discussed  all major risks of surgery. These include: Bleeding, infection and possible wound complications such as hernia, damage to adjacent structures, leak of surgical connections, which can lead to other surgeries and possibly an ostomy (5-7%), possible need for other procedures, such as abscess drains in radiology, possible prolonged hospital stay, possible diarrhea from removal of part of the colon, possible constipation from narcotics, prolonged fatigue/weakness or appetite loss, possible recurrence of disease, possible complications of their medical problems such as heart disease or arrhythmias or lung problems, death (less than 1%). I believe the patient understands and wishes to proceed with the surgery.

## 2013-12-08 NOTE — Preoperative (Signed)
Beta Blockers   Reason not to administer Beta Blockers:Not Applicable 

## 2013-12-08 NOTE — Progress Notes (Signed)
Patient changed to PCA Dilaudid, med verified by Dorris FetchMontressa Blount RN.  Patient instructed on use stated understanding, ice pack placed on lower abd

## 2013-12-08 NOTE — Progress Notes (Signed)
Doctor on call paged patient writhing in bed stating pain is not being reduced by PCA morphine.  Patient arrived to unit with high pain level, report from PACU stated the patient was uncomfortable with pain as soon as she awakened from surgery and was given 2 mg of Versed.  Await call back from MD.  Orders received

## 2013-12-08 NOTE — OR Nursing (Signed)
Wound class and amount of local anesthesia added to chart.

## 2013-12-08 NOTE — Transfer of Care (Signed)
Immediate Anesthesia Transfer of Care Note  Patient: Heather [name]  Procedure(s) Performed: Procedure(s): ROBOT ASSISTED LAPAROSCOPIC PARTIAL COLECTOMY (N/A)  Patient Location: PACU  Anesthesia Type:General  Level of Consciousness: awake, alert , oriented and patient cooperative  Airway & Oxygen Therapy: Patient Spontanous Breathing and Patient connected to face mask oxygen  Post-op Assessment: Report given to PACU RN, Post -op Vital signs reviewed and stable and Patient moving all extremities  Post vital signs: Reviewed and stable  Complications: No apparent anesthesia complications

## 2013-12-09 LAB — CBC
HCT: 34.8 % — ABNORMAL LOW (ref 36.0–46.0)
HEMOGLOBIN: 11.9 g/dL — AB (ref 12.0–15.0)
MCH: 30.5 pg (ref 26.0–34.0)
MCHC: 34.2 g/dL (ref 30.0–36.0)
MCV: 89.2 fL (ref 78.0–100.0)
Platelets: 273 10*3/uL (ref 150–400)
RBC: 3.9 MIL/uL (ref 3.87–5.11)
RDW: 15.1 % (ref 11.5–15.5)
WBC: 12.6 10*3/uL — AB (ref 4.0–10.5)

## 2013-12-09 LAB — BASIC METABOLIC PANEL
BUN: 13 mg/dL (ref 6–23)
CHLORIDE: 103 meq/L (ref 96–112)
CO2: 23 meq/L (ref 19–32)
Calcium: 8.8 mg/dL (ref 8.4–10.5)
Creatinine, Ser: 0.71 mg/dL (ref 0.50–1.10)
GFR calc Af Amer: >90 mL/min (ref >90)
GFR calc non Af Amer: >90 mL/min (ref >90)
GLUCOSE: 151 mg/dL — AB (ref 70–99)
POTASSIUM: 4 meq/L (ref 3.7–5.3)
SODIUM: 137 meq/L (ref 137–147)

## 2013-12-09 MED ORDER — KETOROLAC TROMETHAMINE 30 MG/ML IJ SOLN
30.0000 mg | Freq: Four times a day (QID) | INTRAMUSCULAR | Status: DC
  Administered 2013-12-09 – 2013-12-10 (×6): 30 mg via INTRAVENOUS
  Filled 2013-12-09 (×3): qty 1
  Filled 2013-12-09: qty 2
  Filled 2013-12-09 (×2): qty 1
  Filled 2013-12-09: qty 2
  Filled 2013-12-09 (×2): qty 1
  Filled 2013-12-09: qty 2
  Filled 2013-12-09 (×5): qty 1

## 2013-12-09 MED ORDER — KCL IN DEXTROSE-NACL 20-5-0.45 MEQ/L-%-% IV SOLN
INTRAVENOUS | Status: DC
  Administered 2013-12-09: 75 mL/h via INTRAVENOUS
  Administered 2013-12-10: 21:00:00 via INTRAVENOUS
  Filled 2013-12-09 (×4): qty 1000

## 2013-12-09 NOTE — Progress Notes (Signed)
1 Day Post-Op Rob assisted sigmoidectomy Subjective: Had pain and nausea episodes overnight and this morning.  Feeling better now.  Ambulating some.    Objective: Vital signs in last 24 hours: Temp:  [97.6 F (36.4 C)-98.3 F (36.8 C)] 97.9 F (36.6 C) (04/01 1000) Pulse Rate:  [53-81] 53 (04/01 1000) Resp:  [9-24] 14 (04/01 1201) BP: (96-144)/(50-96) 117/65 mmHg (04/01 1000) SpO2:  [95 %-100 %] 100 % (04/01 1201) Weight:  [187 lb (84.823 kg)] 187 lb (84.823 kg) (03/31 1731)   Intake/Output from previous day: 03/31 0701 - 04/01 0700 In: 3315.4 [I.V.:3215.4; IV Piggyback:100] Out: 650 [Urine:550; Blood:100] Intake/Output this shift: Total I/O In: -  Out: 300 [Urine:300]   General appearance: alert and cooperative GI: soft, mildly distended  Incision: no significant drainage  Lab Results:   Recent Labs  12/09/13 0431  WBC 12.6*  HGB 11.9*  HCT 34.8*  PLT 273   BMET  Recent Labs  12/09/13 0431  NA 137  K 4.0  CL 103  CO2 23  GLUCOSE 151*  BUN 13  CREATININE 0.71  CALCIUM 8.8   PT/INR No results found for this basename: LABPROT, INR,  in the last 72 hours ABG No results found for this basename: PHART, PCO2, PO2, HCO3,  in the last 72 hours  MEDS, Scheduled . enoxaparin (LOVENOX) injection  40 mg Subcutaneous Q24H  . HYDROmorphone PCA 0.3 mg/mL   Intravenous 6 times per day  . ketorolac  30 mg Intravenous 4 times per day    Studies/Results: No results found.  Assessment: s/p Procedure(s): ROBOT ASSISTED LAPAROSCOPIC PARTIAL COLECTOMY Patient Active Problem List   Diagnosis Date Noted  . Diverticular disease 12/08/2013  . Anemia 09/03/2012  . E coli bacteremia 09/03/2012  . Abdominal pain 08/27/2012  . Perforated bowel 08/27/2012  . Acute diverticulitis 08/27/2012  . Colonic diverticular abscess 08/27/2012    Expected post op course  Plan: cont NPO with sips of clears Cont ambulation Minimize IVF's Cont foley   LOS: 1 day      .Vanita PandaAlicia C Thurman Sarver, MD [facility], [place] [phone]   12/09/2013 1:13 PM

## 2013-12-09 NOTE — Evaluation (Signed)
Physical Therapy Evaluation Patient Details Name: Heather [name] MRN: [id number] DOB: [birth year] Today's Date: 12/09/2013   History of Present Illness  Pt is a 43 year old female s/p lap partial colectomy due to diverticular disease.  Clinical Impression  Pt currently with functional limitations due to the deficits listed below (see PT Problem List).  Pt will benefit from skilled PT to increase their independence and safety with mobility to allow discharge to the venue listed below.  Pt assisted with ambulation in hallway however reporting 11/10 abdominal pain.  Spouse present and provided encouraged.  Pt will likely return to baseline upon d/c.     Follow Up Recommendations No PT follow up    Equipment Recommendations  Rolling walker with 5" wheels (however may progress to no needs)    Recommendations for Other Services       Precautions / Restrictions Precautions Precautions: Fall Precaution Comments: PCA      Mobility  Bed Mobility Overal bed mobility: Needs Assistance Bed Mobility: Supine to Sit     Supine to sit: Supervision     General bed mobility comments: assist for management of lines  Transfers Overall transfer level: Needs assistance Equipment used: Rolling walker (2 wheeled) Transfers: Sit to/from Stand Sit to Stand: Min guard         General transfer comment: verbal cues for safety and managing lines  Ambulation/Gait Ambulation/Gait assistance: Min guard Ambulation Distance (Feet): 100 Feet Assistive device: Rolling walker (2 wheeled) Gait Pattern/deviations: Step-through pattern;Trunk flexed Gait velocity: decr   General Gait Details: pt with decreased speed, increased trunk flexion, and increased dependence on UE support through RW mostly due to pain 11/10, pt used PCA  Stairs            Wheelchair Mobility    Modified Rankin (Stroke Patients Only)       Balance                                     Pertinent  Vitals/Pain 11/10 abdominal pain with ambulation, PCA encouraged, activity to tolerance, repositioned in recliner, RN in room     Home Living Family/patient expects to be discharged to:: Private residence Living Arrangements: Spouse/significant other   Type of Home: House Home Access: Ramped entrance     Home Layout: One level Home Equipment: None      Prior Function Level of Independence: Independent               Hand Dominance        Extremity/Trunk Assessment               Lower Extremity Assessment: Overall WFL for tasks assessed         Communication   Communication: No difficulties  Cognition Arousal/Alertness: Awake/alert   Overall Cognitive Status: Within Functional Limits for tasks assessed                      General Comments      Exercises        Assessment/Plan    PT Assessment Patient needs continued PT services  PT Diagnosis Difficulty walking;Acute pain   PT Problem List Decreased activity tolerance;Decreased mobility;Pain  PT Treatment Interventions Gait training;DME instruction;Functional mobility training;Therapeutic activities;Therapeutic exercise;Patient/family education   PT Goals (Current goals can be found in the Care Plan section) Acute Rehab PT Goals PT Goal Formulation: With patient Time For Goal  Achievement: 12/16/13 Potential to Achieve Goals: Good    Frequency Min 3X/week   Barriers to discharge        End of Session   Activity Tolerance: Patient limited by pain Patient left: in chair;with call bell/phone within reach;with family/visitor present;with nursing/sitter in room         Time: [id number] PT Time Calculation (min): 15 min   Charges:   PT Evaluation $Initial PT Evaluation Tier I: 1 Procedure PT Treatments $Gait Training: 8-22 mins   PT G Codes:          Hendryx Ricke,KATHrine E 12/09/2013, 12:28 PM Zenovia JarredKati Litisha Guagliardo, PT, DPT 12/09/2013 Pager: [phone]

## 2013-12-09 NOTE — Op Note (Signed)
12/08/2013  8:28 AM  PATIENT:  Heather [name]  43 y.o. female  Patient Care Team: No Pcp Per Patient as PCP - General (General Practice)  PRE-OPERATIVE DIAGNOSIS:  diverticular disease   POST-OPERATIVE DIAGNOSIS:  diverticular disease  PROCEDURE:  ROBOT ASSISTED PARTIAL COLECTOMY  SURGEON:  Surgeon(s): Romie LeveeAlicia Santana Edell, MD Axel FillerArmando Ramirez, MD  ASSISTANT: Derrell Lollingamirez   ANESTHESIA:   general  EBL:   100ml  DRAINS: none   SPECIMEN:  Source of Specimen:  sigmoid colon  DISPOSITION OF SPECIMEN:  PATHOLOGY  COUNTS:  YES  PLAN OF CARE: Admit to inpatient   PATIENT DISPOSITION:  PACU - hemodynamically stable.  INDICATION: This is a 43 year old female who presented to the office approximately one year ago with a recent hospitalization of perforated diverticulitis with drain placement. She was seen in followup and scheduled for surgery are grossly 3 months later. She then was lost to followup and did not undergo surgery. She then presented to the hospital approximately 3 months ago with a recurrent episode of diverticulitis and a small abscess. This resolved with IV antibiotics. She is here today for resection.   OR FINDINGS: Mid sigmoid inflammation consistent with diverticulitis with anterior pelvic abscess  DESCRIPTION: the patient was identified in the preoperative holding area and taken to the OR where they were laid Supine on the operating room table.  Gen. anesthesia was induced without difficulty. SCDs were also noted to be in place prior to the initiation of anesthesia.  A Foley catheter was placed under sterile conditions. The patient was placed in lithotomy position and appropriately secured and padded.  The patient was then prepped and draped in the usual sterile fashion.    A surgical timeout was performed indicating the correct patient, procedure, positioning and need for preoperative antibiotics.   I began by using a varies needle to insufflate the abdomen in the left upper  quadrant. After correct placement was confirmed with a water test, the abdomen was insufflated to approximately 15 mm mercury.  An 8mm robotic port was then placed through the abdominal wall in the right upper quadrant.  The robotic camera was then placed into the abdomen manually and the abdomen was inspected. There was no signs of other intra-abdominal disease. There was a small adherence of the omentum to the abdominal wall at her previous Pfannenstiel incision.  I inspected the left upper quadrant the area of entry of the varies needle. There was no signs of bleeding or injury noted.  The remaining robotic ports were placed under direct visualization in standard fashion. A 5 mm assist port was placed in the right upper quadrant also under direct visualization. The [employer] XI robot was then talked to the patient's left side in standard fashion.  After this was completed I manipulated the robotic console to take down her omental adhesions using cautery and blunt dissection. After this I mobilized her left colon from the abdominal sidewall using blunt and sharp dissection. We then followed the sigmoid colon down into the pelvis. There were some adhesions noted to the left fallopian tube which were carefully taken down using sharp dissection. Adhesions were noted anteriorly around the area of the bladder. These were taken down using sharp and blunt dissection. The uterus was then elevated and the remaining pelvic adhesions were dissected free using blunt and sharp dissection. The abscess cavity connection was noted anteriorly below the uterus and was taken down using the robotic scissors.  After this was completed the sigmoid colon was freed  and elevated out of the pelvis. The rectosigmoid junction was identified. The mesorectum was divided at this point. I then evaluated the mesenteric vessels. The artery and vein were dissected free from surrounding tissues and the left ureter was identified and preserved. The  arteries were clipped using hemoclips x3. The vein was also clipped. These were then transected using scissors. The remaining mesentery was taken down using electrocautery to the level of the sigmoid that was free of diverticular disease proximally.  After this, The robot was undocked and the abdomen was desufflated.  We made a small Pfannenstiel incision through her previous incision site using an 11 blade scalpel. Dissection was carried down through subcutaneous tissues and fascia using cautery. The fascia was elevated from the rectus muscles caudally and cranially. The peritoneum was entered vertically. An Alexis wound protector was placed. The sigmoid colon was brought out of the abdomen and transected using a Blue load Contour stapler proximally and distally.  This is a pathology for further examination. The proximal staple line was removed using electric cautery. A 2-0 Prolene pursestring was placed into the distal portion of the proximal colon.  A 29 mm EEA stapler was brought onto the field. The anvil was placed into the distal portion of the descending colon and tied securely with the Prolene pursestring suture.  The stapler was introduced rectally and brought out through the rectal stump. The anastomosis was created without tension. There was no leak noted upon insufflation under water. Hemostasis was good. The abdomen was irrigated with a liter of warm normal saline.  The robotic ports were removed. The omentum was then brought down into the pelvis and the peritoneum was closed using a running 2-0 Vicryl suture. The fascia was then closed horizontally using 2 #1 running Novafil sutures. The subcutaneous tissue were then closed using interrupted 2-0 Vicryl sutures. The skin of all incisions were closed using staples. Sterile dressings were applied. The patient was awakened from anesthesia and sent to the post anesthesia care unit in stable condition. All counts were correct per operating room staff.

## 2013-12-10 LAB — CBC
HCT: 29 % — ABNORMAL LOW (ref 36.0–46.0)
Hemoglobin: 9.8 g/dL — ABNORMAL LOW (ref 12.0–15.0)
MCH: 30.3 pg (ref 26.0–34.0)
MCHC: 33.8 g/dL (ref 30.0–36.0)
MCV: 89.8 fL (ref 78.0–100.0)
PLATELETS: 218 10*3/uL (ref 150–400)
RBC: 3.23 MIL/uL — AB (ref 3.87–5.11)
RDW: 15.2 % (ref 11.5–15.5)
WBC: 8.8 10*3/uL (ref 4.0–10.5)

## 2013-12-10 LAB — BASIC METABOLIC PANEL
BUN: 11 mg/dL (ref 6–23)
CALCIUM: 8.5 mg/dL (ref 8.4–10.5)
CO2: 25 meq/L (ref 19–32)
CREATININE: 0.83 mg/dL (ref 0.50–1.10)
Chloride: 107 mEq/L (ref 96–112)
GFR calc Af Amer: >90 mL/min (ref >90)
GFR calc non Af Amer: 85 mL/min — ABNORMAL LOW (ref >90)
Glucose, Bld: 101 mg/dL — ABNORMAL HIGH (ref 70–99)
Potassium: 3.8 mEq/L (ref 3.7–5.3)
SODIUM: 140 meq/L (ref 137–147)

## 2013-12-10 MED ORDER — ALVIMOPAN 12 MG PO CAPS
12.0000 mg | ORAL_CAPSULE | Freq: Two times a day (BID) | ORAL | Status: DC
  Administered 2013-12-10: 12 mg via ORAL
  Filled 2013-12-10 (×3): qty 1

## 2013-12-10 MED ORDER — KETOROLAC TROMETHAMINE 15 MG/ML IJ SOLN
30.0000 mg | Freq: Four times a day (QID) | INTRAMUSCULAR | Status: DC
  Administered 2013-12-10 – 2013-12-11 (×2): 30 mg via INTRAVENOUS
  Filled 2013-12-10: qty 2

## 2013-12-10 NOTE — Progress Notes (Signed)
2 Days Post-Op Rob assisted sigmoidectomy Subjective:   Feeling better now.  No nausea.  Ambulating in hall.  No flatus  Objective: Vital signs in last 24 hours: Temp:  [97.9 F (36.6 C)-99.7 F (37.6 C)] 98.5 F (36.9 C) (04/02 0630) Pulse Rate:  [53-68] 68 (04/02 0630) Resp:  [10-18] 10 (04/02 0816) BP: (97-117)/(51-65) 106/55 mmHg (04/02 0630) SpO2:  [96 %-100 %] 97 % (04/02 0630)   Intake/Output from previous day: 04/01 0701 - 04/02 0700 In: 893.8 [I.V.:893.8] Out: 1450 [Urine:1450] Intake/Output this shift:   General appearance: alert and cooperative GI: soft, mildly distended  Incision: mild clear drainage in left side of incision   Lab Results:   Recent Labs  12/09/13 0431 12/10/13 0416  WBC 12.6* 8.8  HGB 11.9* 9.8*  HCT 34.8* 29.0*  PLT 273 218   BMET  Recent Labs  12/09/13 0431 12/10/13 0416  NA 137 140  K 4.0 3.8  CL 103 107  CO2 23 25  GLUCOSE 151* 101*  BUN 13 11  CREATININE 0.71 0.83  CALCIUM 8.8 8.5   PT/INR No results found for this basename: LABPROT, INR,  in the last 72 hours ABG No results found for this basename: PHART, PCO2, PO2, HCO3,  in the last 72 hours  MEDS, Scheduled . alvimopan  12 mg Oral BID  . enoxaparin (LOVENOX) injection  40 mg Subcutaneous Q24H  . HYDROmorphone PCA 0.3 mg/mL   Intravenous 6 times per day  . ketorolac  30 mg Intravenous 4 times per day    Studies/Results: No results found.  Assessment: s/p Procedure(s): ROBOT ASSISTED LAPAROSCOPIC PARTIAL COLECTOMY Patient Active Problem List   Diagnosis Date Noted  . Diverticular disease 12/08/2013  . Anemia 09/03/2012  . E coli bacteremia 09/03/2012  . Abdominal pain 08/27/2012  . Perforated bowel 08/27/2012  . Acute diverticulitis 08/27/2012  . Colonic diverticular abscess 08/27/2012    Expected post op course  Plan: Advance to clear liquids Decrease MIV to 1150ml/h Cont ambulation Acute blood loss anemia: will monitor with recheck Hgb in AM.   Pt mildly hypotensive but not tachycardic.  Denies SOB or dizziness.  No signs of active bleeding. Foley out   LOS: 2 days     .Vanita PandaAlicia C Genavive Kubicki, MD [facility], [place] [phone]   12/10/2013 8:40 AM

## 2013-12-11 LAB — BASIC METABOLIC PANEL
BUN: 7 mg/dL (ref 6–23)
CHLORIDE: 102 meq/L (ref 96–112)
CO2: 25 mEq/L (ref 19–32)
Calcium: 8.6 mg/dL (ref 8.4–10.5)
Creatinine, Ser: 0.74 mg/dL (ref 0.50–1.10)
GFR calc Af Amer: >90 mL/min (ref >90)
GFR calc non Af Amer: >90 mL/min (ref >90)
Glucose, Bld: 89 mg/dL (ref 70–99)
Potassium: 3.7 mEq/L (ref 3.7–5.3)
Sodium: 136 mEq/L — ABNORMAL LOW (ref 137–147)

## 2013-12-11 LAB — CBC
HEMATOCRIT: 28.8 % — AB (ref 36.0–46.0)
Hemoglobin: 9.7 g/dL — ABNORMAL LOW (ref 12.0–15.0)
MCH: 30.4 pg (ref 26.0–34.0)
MCHC: 33.7 g/dL (ref 30.0–36.0)
MCV: 90.3 fL (ref 78.0–100.0)
Platelets: 232 10*3/uL (ref 150–400)
RBC: 3.19 MIL/uL — AB (ref 3.87–5.11)
RDW: 15.1 % (ref 11.5–15.5)
WBC: 7.9 10*3/uL (ref 4.0–10.5)

## 2013-12-11 MED ORDER — OXYCODONE-ACETAMINOPHEN 5-325 MG PO TABS
1.0000 | ORAL_TABLET | ORAL | Status: DC | PRN
  Administered 2013-12-11 – 2013-12-12 (×4): 2 via ORAL
  Administered 2013-12-12: 1 via ORAL
  Filled 2013-12-11 (×2): qty 2
  Filled 2013-12-11: qty 1
  Filled 2013-12-11 (×2): qty 2

## 2013-12-11 MED ORDER — IBUPROFEN 800 MG PO TABS
400.0000 mg | ORAL_TABLET | Freq: Four times a day (QID) | ORAL | Status: DC | PRN
  Administered 2013-12-11 – 2013-12-12 (×2): 800 mg via ORAL
  Filled 2013-12-11 (×2): qty 1

## 2013-12-11 MED ORDER — HYDROMORPHONE HCL PF 1 MG/ML IJ SOLN
0.5000 mg | INTRAMUSCULAR | Status: DC | PRN
  Administered 2013-12-12: 0.5 mg via INTRAVENOUS
  Filled 2013-12-11: qty 1

## 2013-12-11 NOTE — Progress Notes (Signed)
PT Cancellation/Discharge Note  Patient Details Name: Heather [name] MRN: [id number] DOB: June 20, 1971   Cancelled Treatment:    Reason Eval/Treat Not Completed: PT screened, no needs identified, will sign off--pt denied any further needs from PT. Will sign off.    Rebeca AlertJannie [name], MPT Pager: [phone]

## 2013-12-11 NOTE — Progress Notes (Signed)
3 Days Post-Op Rob assisted sigmoidectomy Subjective:   Feeling better.  No nausea.  Ambulating in hall.  Having flatus and BM's.  Has a pain in her RUQ that is relieved with toradol but not narcotics.    Objective: Vital signs in last 24 hours: Temp:  [98.1 F (36.7 C)-98.9 F (37.2 C)] 98.9 F (37.2 C) (04/03 0648) Pulse Rate:  [52-76] 76 (04/03 0648) Resp:  [11-18] 14 (04/03 0824) BP: (103-119)/(72-78) 119/78 mmHg (04/03 0648) SpO2:  [96 %-100 %] 96 % (04/03 0648) FiO2 (%):  [32 %] 32 % (04/02 2336)   Intake/Output from previous day: 04/02 0701 - 04/03 0700 In: 1100 [I.V.:1100] Out: 1200 [Urine:1200] Intake/Output this shift:   General appearance: alert and cooperative GI: soft, mildly distended  Incision: clean, dry, intact.  Dressing removed  Lab Results:   Recent Labs  12/10/13 0416 12/11/13 0430  WBC 8.8 7.9  HGB 9.8* 9.7*  HCT 29.0* 28.8*  PLT 218 232   BMET  Recent Labs  12/10/13 0416 12/11/13 0430  NA 140 136*  K 3.8 3.7  CL 107 102  CO2 25 25  GLUCOSE 101* 89  BUN 11 7  CREATININE 0.83 0.74  CALCIUM 8.5 8.6   PT/INR No results found for this basename: LABPROT, INR,  in the last 72 hours ABG No results found for this basename: PHART, PCO2, PO2, HCO3,  in the last 72 hours  MEDS, Scheduled . alvimopan  12 mg Oral BID  . enoxaparin (LOVENOX) injection  40 mg Subcutaneous Q24H  . HYDROmorphone PCA 0.3 mg/mL   Intravenous 6 times per day  . ketorolac  30 mg Intravenous 4 times per day    Studies/Results: No results found.  Assessment: s/p Procedure(s): ROBOT ASSISTED LAPAROSCOPIC PARTIAL COLECTOMY Patient Active Problem List   Diagnosis Date Noted  . Diverticular disease 12/08/2013  . Anemia 09/03/2012  . E coli bacteremia 09/03/2012  . Abdominal pain 08/27/2012  . Perforated bowel 08/27/2012  . Acute diverticulitis 08/27/2012  . Colonic diverticular abscess 08/27/2012    Expected post op course  Plan: Advance to soft  diet SL IVF's Cont ambulation Acute blood loss anemia: Hgb stable.  Vitals stable. No signs of active bleeding. D/c toradol  PO narcotics, d/c pca Possible d/c tom   LOS: 3 days     .Vanita PandaAlicia C Antwine Agosto, MD [facility], [place] [phone]   12/11/2013 11:01 AM

## 2013-12-12 MED ORDER — OXYCODONE-ACETAMINOPHEN 5-325 MG PO TABS: 1.0000 | ORAL_TABLET | ORAL | Status: DC | PRN

## 2013-12-12 NOTE — Discharge Instructions (Signed)

## 2013-12-12 NOTE — Discharge Summary (Signed)
Physician Discharge Summary  Patient ID: Heather [name] MRN: [id number] DOB/AGE: 1971-09-02 43 y.o.  Admit date: 12/08/2013 Discharge date: 12/12/2013  Admission Diagnoses: Diverticular disease, h/o diverticular abscess  Discharge Diagnoses:  Active Problems:   Diverticular disease   Discharged Condition: stable  Hospital Course: The patient was admitted after robotic assisted sigmoidectomy.  She had significant pain and nausea overnight and was switched to a dilaudid PCA.  Toradol was added as well the next morning.  She began to progress after that.  She was started on clear liquids on POD 2.  Her foley was removed by POD 2.  This was advanced to a regular diet on POD 3 after a BM.  She was switched to PO meds.  By POD 4 she was tolerating a diet, having bowel function and her pain was controlled with oral medications.    Consults: None  Significant Diagnostic Studies: labs: cbc, chemistry  Treatments: IV hydration and surgery: robotic assisted sigmoidectomy   Discharge Exam: Blood pressure 114/79, pulse 76, temperature 98.3 F (36.8 C), temperature source Oral, resp. rate 14, height 5\' 2"  (1.575 m), weight 187 lb (84.823 kg), SpO2 98.00%. General appearance: alert and cooperative GI: normal findings: soft, non-tender Incision/Wound: clean, dry, small amt of separation in incision, steristrip applied  Disposition: 01-Home or Self Care     Medication List    STOP taking these medications       docusate sodium 100 MG capsule  Commonly known as:  COLACE     metroNIDAZOLE 500 MG tablet  Commonly known as:  FLAGYL      TAKE these medications       ibuprofen 200 MG tablet  Commonly known as:  ADVIL,MOTRIN  Take 600 mg by mouth every 6 (six) hours as needed for mild pain or moderate pain.     oxyCODONE-acetaminophen 5-325 MG per tablet  Commonly known as:  PERCOCET/ROXICET  Take 1-2 tablets by mouth every 4 (four) hours as needed for severe pain.           Follow-up  Information   Follow up with Vanita PandaHOMAS, Lewi Drost C., MD In 2 weeks. (RN will call to set up apt for staple removal Th or Fri)    Specialty:  General Surgery   Contact information:   [address] [place] [address] [phone]       Signed: Vanita PandaHOMAS, Brendia Dampier C. 12/12/2013, 10:04 AM

## 2013-12-14 ENCOUNTER — Telehealth (INDEPENDENT_AMBULATORY_CARE_PROVIDER_SITE_OTHER): Payer: Self-pay

## 2013-12-14 NOTE — Telephone Encounter (Signed)
Pt called wanting to know if she can take advil in between her pain med. Pt states she is still very sore with current pain med.No fever. No vomiting. Passing flatus. Voiding well.  Pt advised per protocol she can take 2- 200 mg advil in between her percocet dosage. Pt to call if this does not help with pain control.

## 2013-12-17 ENCOUNTER — Encounter (INDEPENDENT_AMBULATORY_CARE_PROVIDER_SITE_OTHER): Payer: Medicaid Other

## 2013-12-22 ENCOUNTER — Ambulatory Visit (INDEPENDENT_AMBULATORY_CARE_PROVIDER_SITE_OTHER): Payer: Medicaid Other | Admitting: General Surgery

## 2013-12-22 ENCOUNTER — Encounter (INDEPENDENT_AMBULATORY_CARE_PROVIDER_SITE_OTHER): Payer: Self-pay | Admitting: General Surgery

## 2013-12-22 ENCOUNTER — Encounter (INDEPENDENT_AMBULATORY_CARE_PROVIDER_SITE_OTHER): Payer: Self-pay

## 2013-12-22 VITALS — BP 98/64 | HR 70 | Temp 97.7°F | Resp 16 | Ht 62.0 in | Wt 184.4 lb

## 2013-12-22 DIAGNOSIS — K5730 Diverticulosis of large intestine without perforation or abscess without bleeding: Secondary | ICD-10-CM

## 2013-12-22 DIAGNOSIS — K5790 Diverticulosis of intestine, part unspecified, without perforation or abscess without bleeding: Secondary | ICD-10-CM

## 2013-12-22 MED ORDER — OXYCODONE-ACETAMINOPHEN 5-325 MG PO TABS: 1.0000 | ORAL_TABLET | ORAL | Status: AC | PRN

## 2013-12-22 NOTE — Progress Notes (Signed)
Heather [name] is a 43 y.o. female who is status post a robotic sigmoidectomy on 3/31.  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 She is having some abdominal pain and constipation. She is passing flatus and denies any fevers. She denies any rectal bleeding. She denies any nausea or vomiting.  Objective: Filed Vitals:   12/22/13 1215  BP: 98/64  Pulse: 70  Temp: 97.7 F (36.5 C)  Resp: 16    General appearance: alert and cooperative GI: normal findings: soft, non-tender and no hernia  Incision: healing well   Assessment: s/p  Patient Active Problem List   Diagnosis Date Noted  . Diverticular disease 12/08/2013  . Anemia 09/03/2012  . E coli bacteremia 09/03/2012  . Abdominal pain 08/27/2012    Plan: DOing well.  Staples removed. Patient given samples of MiraLax for constipation and refill of narcotics prescription. I will see her back in the office in 2 weeks. I feel like she could return to work next week if she would like.    Marland Kitchen.Vanita PandaAlicia C Charlayne Vultaggio, MD [facility], [place] [phone]   12/22/2013 12:28 PM

## 2013-12-22 NOTE — Patient Instructions (Signed)
Using MiraLax once to twice a day or to help with constipation. If no bowel movement over the next 48 hours with the MiraLax, use a fleets enema. Call the office if there is still problems with constipation after this. Make sure to take Colace stool softeners 100 mg twice a day while taking narcotic pain medication.

## 2013-12-28 ENCOUNTER — Telehealth (INDEPENDENT_AMBULATORY_CARE_PROVIDER_SITE_OTHER): Payer: Self-pay

## 2013-12-28 NOTE — Telephone Encounter (Signed)
Pt s/p lap partial colectomy on 12/08/13.  She was given a refill of her Oxycodone on 4/15 by Dr. Maisie Fushomas.  Pt claims she "left her bag with medication in it on the bus".  Please advise if another Rx can be written.  Pt returns to work next week.

## 2013-12-28 NOTE — Telephone Encounter (Signed)
Pt made of Dr. Maisie Fushomas' response.

## 2013-12-28 NOTE — Telephone Encounter (Signed)
No, I do not replace prescriptions.

## 2014-01-05 ENCOUNTER — Other Ambulatory Visit (INDEPENDENT_AMBULATORY_CARE_PROVIDER_SITE_OTHER): Payer: Self-pay

## 2014-01-05 ENCOUNTER — Telehealth (INDEPENDENT_AMBULATORY_CARE_PROVIDER_SITE_OTHER): Payer: Self-pay

## 2014-01-05 ENCOUNTER — Encounter (INDEPENDENT_AMBULATORY_CARE_PROVIDER_SITE_OTHER): Payer: Self-pay | Admitting: General Surgery

## 2014-01-05 ENCOUNTER — Ambulatory Visit (INDEPENDENT_AMBULATORY_CARE_PROVIDER_SITE_OTHER): Payer: Medicaid Other | Admitting: General Surgery

## 2014-01-05 VITALS — BP 130/74 | HR 65 | Temp 98.6°F | Resp 18 | Ht 64.0 in | Wt 184.0 lb

## 2014-01-05 DIAGNOSIS — Z9889 Other specified postprocedural states: Secondary | ICD-10-CM

## 2014-01-05 DIAGNOSIS — Z9049 Acquired absence of other specified parts of digestive tract: Secondary | ICD-10-CM

## 2014-01-05 NOTE — Telephone Encounter (Signed)
CT abd/pelvis w contrast 01/06/14 [facility] @ 8am NPO after MN ,start drinking contrast @ 6am and 7am ,[facility] /contrast given to patient

## 2014-01-05 NOTE — Patient Instructions (Signed)
We will get a CT scan to evaluate her abdominal pain. Continue the MiraLax for your constipation. Return to the office in 3 weeks.

## 2014-01-05 NOTE — Progress Notes (Signed)
Heather [name] is a 43 y.o. female who is status post a robotic sigmoidectomy on 3/31. 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 She is having some abdominal pain and constipation. She is passing flatus and denies any fevers. She denies any rectal bleeding. She denies any nausea or vomiting.  Objective:  Filed Vitals:   01/05/14 1230  BP: 130/74  Pulse: 65  Temp: 98.6 F (37 C)  Resp: 18    General appearance: alert and cooperative  GI: normal findings: soft, non-tender and no hernia  Incision: healing well, I do palpate a midline mass  Assessment:  s/p  Patient Active Problem List    Diagnosis  Date Noted   .  Diverticular disease  12/08/2013   .  Anemia  09/03/2012   .  E coli bacteremia  09/03/2012   .  Abdominal pain  08/27/2012    Plan:  Will get CT to evaluate abd wall mass and bowel function/abd pain.  RTO in 3 wks.  Vanita Panda.Rylinn Linzy C Cecelia Graciano, MD  [facility], [place]  [phone]

## 2014-01-06 ENCOUNTER — Ambulatory Visit (HOSPITAL_COMMUNITY)
Admission: RE | Admit: 2014-01-06 | Discharge: 2014-01-06 | Disposition: A | Discharge status: Home / Self Care | Payer: Medicaid Other | Source: Ambulatory Visit | Attending: General Surgery | Admitting: General Surgery

## 2014-01-06 DIAGNOSIS — R109 Unspecified abdominal pain: Secondary | ICD-10-CM | POA: Insufficient documentation

## 2014-01-06 MED ORDER — IOHEXOL 300 MG/ML  SOLN
80.0000 mL | Freq: Once | INTRAMUSCULAR | Status: AC | PRN
  Administered 2014-01-06: 80 mL via INTRAVENOUS

## 2014-01-08 ENCOUNTER — Telehealth (INDEPENDENT_AMBULATORY_CARE_PROVIDER_SITE_OTHER): Payer: Self-pay

## 2014-01-08 NOTE — Telephone Encounter (Signed)
Patient calling into office for CT results.  Patient advised that I will forward the request to Dr. Maisie Fushomas and we will call patient back with results.

## 2014-01-08 NOTE — Telephone Encounter (Signed)
[facility] with results to call to patient.  Please tell her that the CT showed no signs of complications.  She has some small fluid collections in her abd wall that do not appear to be infection just seroma.

## 2014-01-08 NOTE — Telephone Encounter (Signed)
Called patient to discuss CT results (call was dropped) 2nd attempt to call patient back and received voicemail.  Left message for patient to call our office RE:  CT showed no signs of complications.  Patient has some fluid collections in her abdominal wall that do not appear to be infection, just seroma.

## 2014-01-11 NOTE — Telephone Encounter (Signed)
Patient is aware of CT results P/O appt scheduled 01-20-14@12n  with Dr. Maisie Fushomas

## 2014-01-20 ENCOUNTER — Encounter (INDEPENDENT_AMBULATORY_CARE_PROVIDER_SITE_OTHER): Payer: Medicaid Other | Admitting: General Surgery

## 2014-01-20 ENCOUNTER — Telehealth (INDEPENDENT_AMBULATORY_CARE_PROVIDER_SITE_OTHER): Payer: Self-pay

## 2014-01-20 NOTE — Telephone Encounter (Signed)
Patient did not make appointment 01/20/14 5p , Dr. Maisie Fushomas states if patient is doing ok she does not need to make appointment ,just call if she has any reoccurrence.

## 2014-07-15 IMAGING — CT CT ABD-PELV W/ CM
2 of 4 series · 17 of 46 positions shown, 19 images · IV contrast (APPLIED)
Comparison: CT abdomen and pelvis 08/27/2012.

CLINICAL DATA: Mid abdominal pain.  Nausea.  Recent diagnosis of
diverticulitis with abscess.

CT ABDOMEN AND PELVIS WITH CONTRAST
TECHNIQUE: Multidetector CT imaging of the abdomen and pelvis was
performed following the standard protocol during bolus
administration of intravenous contrast.
Contrast: 100mL OMNIPAQUE IOHEXOL 300 MG/ML. Oral contrast was also
administered.

[Series 5: abd/pelv with 5.0 b31f st · axial · 0.75mm/px · z∈[-608,-188]mm · 14 of 94 slices shown, 16 images]
[im 5/94  soft-tissue]
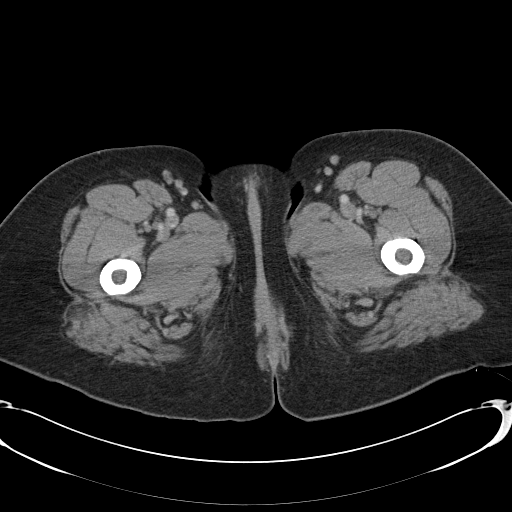
[im 5/94  bone]
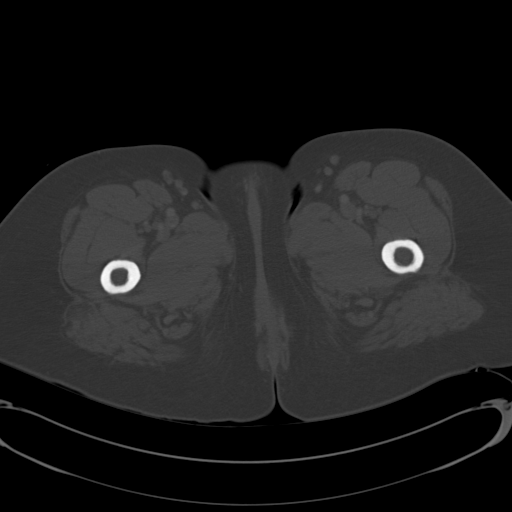
[im 13/94  soft-tissue]
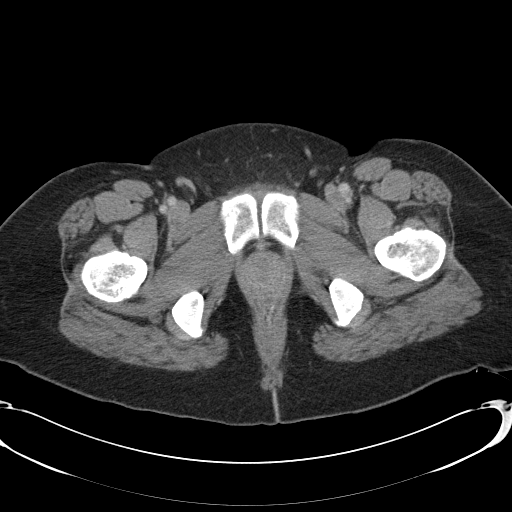
[im 17/94  soft-tissue]
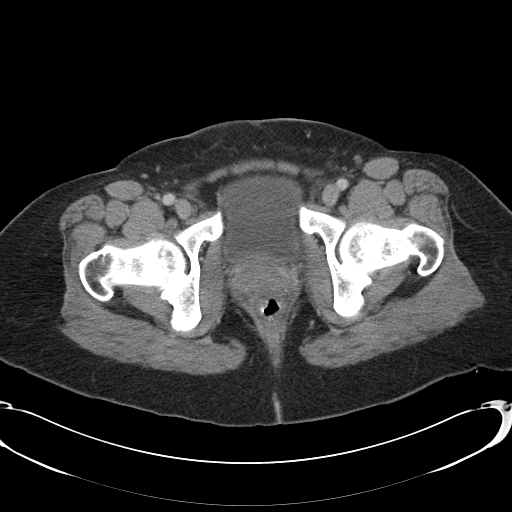
[im 25/94  soft-tissue]
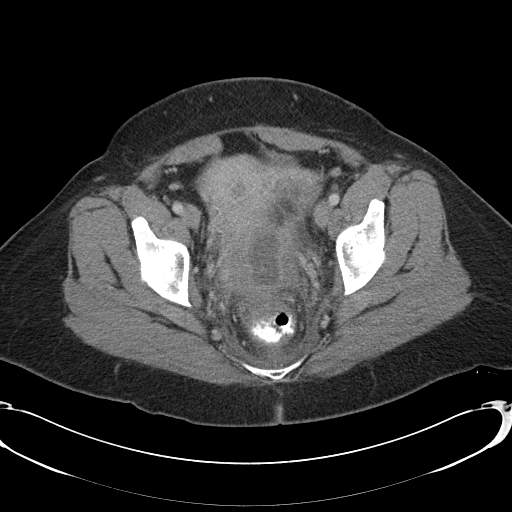
[im 33/94  soft-tissue]
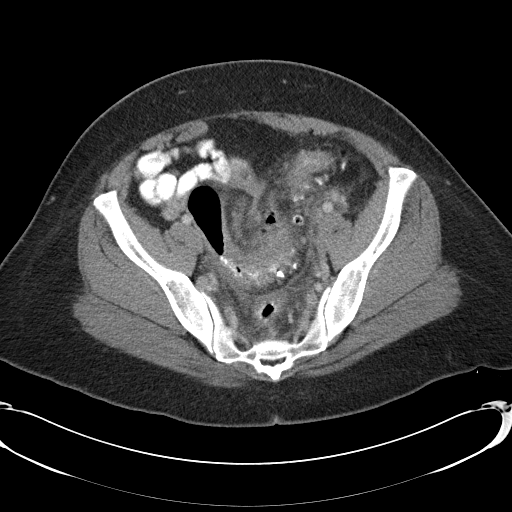
[im 37/94  soft-tissue]
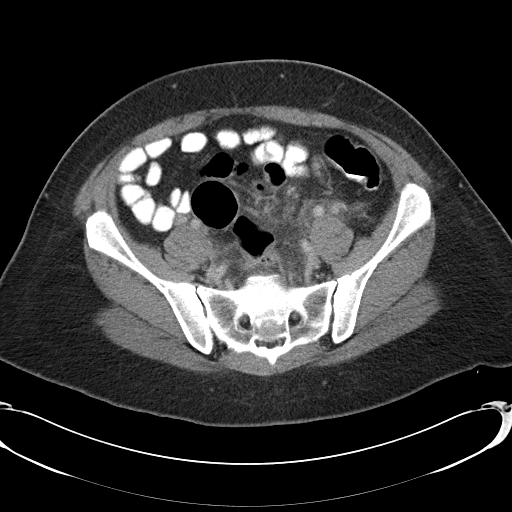
[im 45/94  soft-tissue]
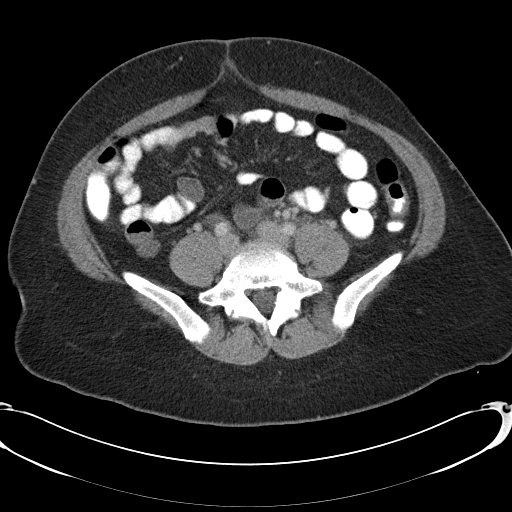
[im 49/94  soft-tissue]
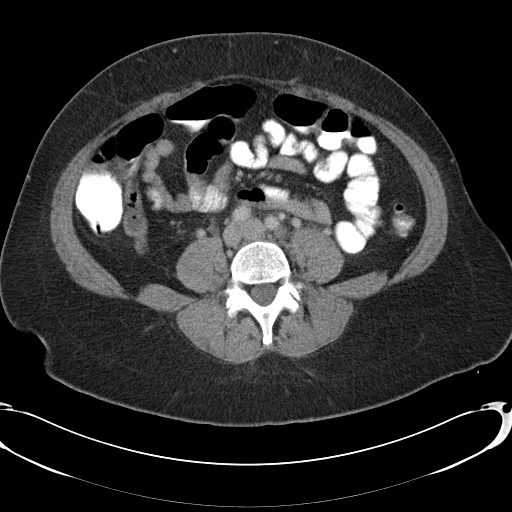
[im 57/94  soft-tissue]
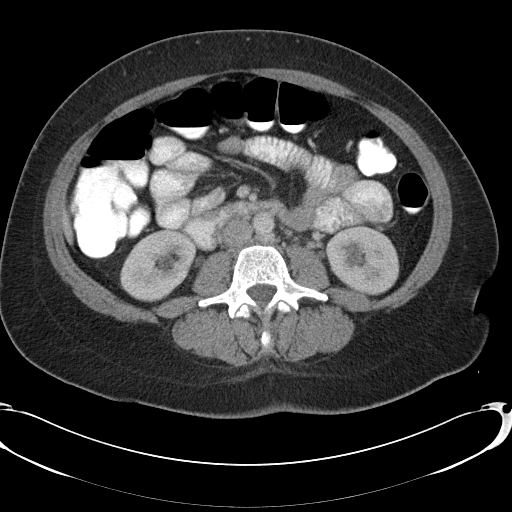
[im 57/94  bone]
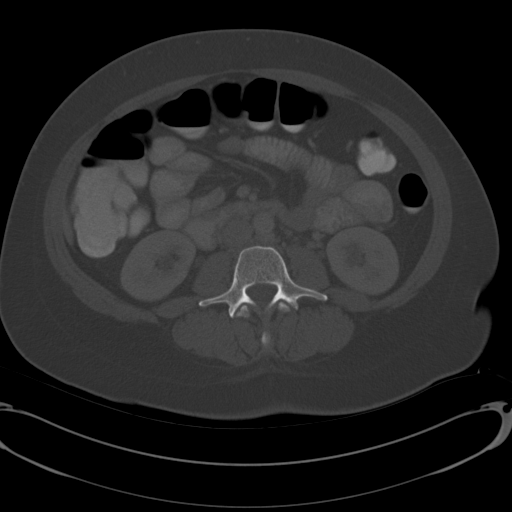
[im 61/94  soft-tissue]
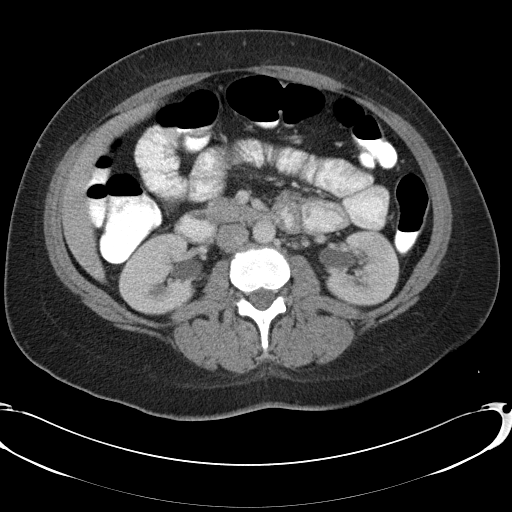
[im 69/94  soft-tissue]
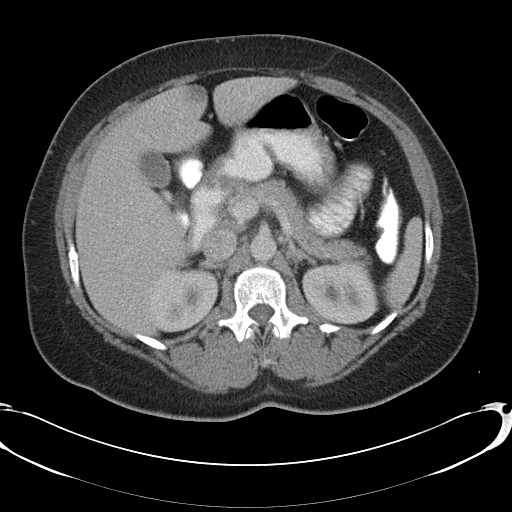
[im 77/94  soft-tissue]
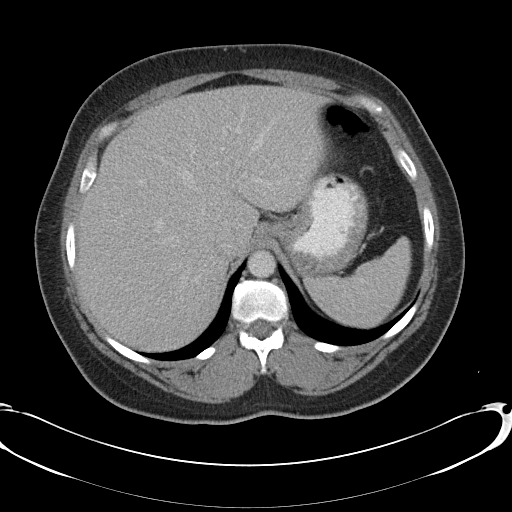
[im 81/94  soft-tissue]
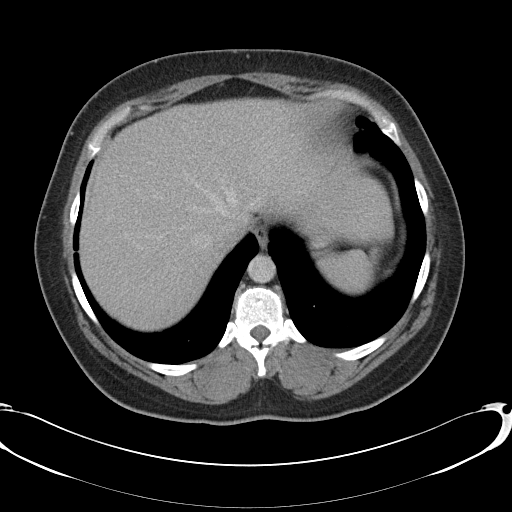
[im 89/94  soft-tissue]
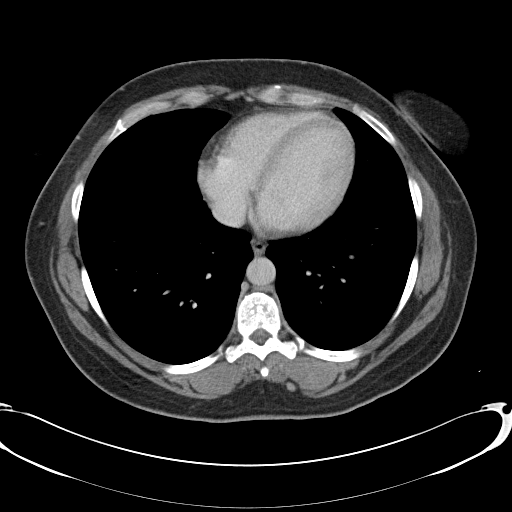

[Series 604: cor · coronal · 0.92mm/px · 3 of 162 slices shown]
[im 54/162  soft-tissue]
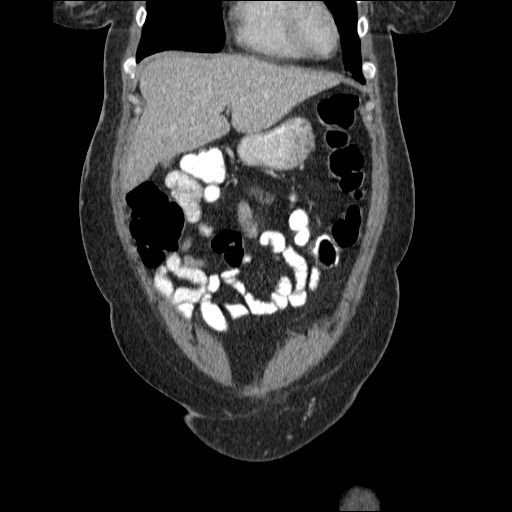
[im 72/162  soft-tissue]
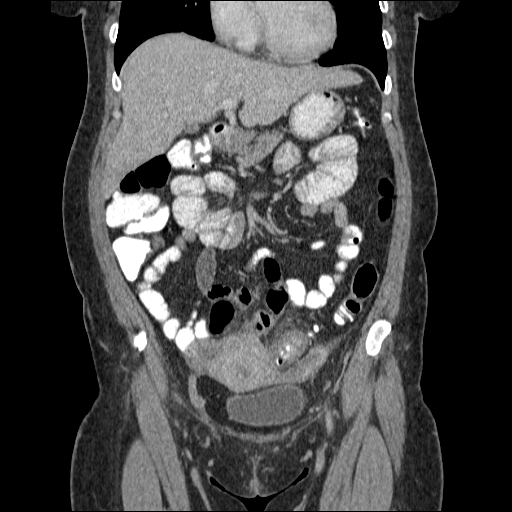
[im 90/162  soft-tissue]
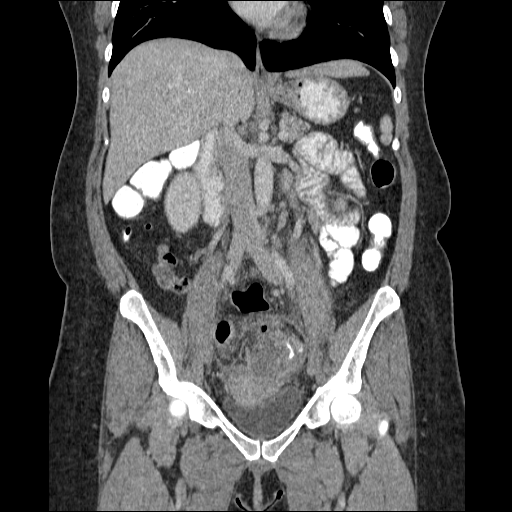

[17 of 46 positions shown; findings below may reference images not displayed]

FINDINGS: Edema/inflammation surrounding the proximal sigmoid colon
in an area of extensive diverticulosis, associated with wall
thickening.  The abscess has increased slightly in size in the
interval, current measurements approximating 5.6 x 4.2 x 4.9 cm
(series 5, image 66 and coronal image 95), previously approximately
4.2 x 3.6 x 4.1 cm.  No new pelvic abscess. No free intraperitoneal
air.  No ascites.

Scattered diverticula in the descending colon.  Remainder of the
colon normal in appearance.  Normal-appearing small bowel and
stomach.  Normal appendix in the right mid abdomen, as the cecum is
positioned in the right upper quadrant.

Normal appearing liver, spleen, pancreas, adrenal glands, and
kidneys.  Gallbladder unremarkable by CT.  No biliary ductal
dilation.  No visible aorto-iliofemoral atherosclerosis.  No
significant lymphadenopathy.

Uterus and ovaries normal in appearance.  Phleboliths low in the
pelvis.  Urinary bladder decompressed and unremarkable.

Visualized lung bases clear.  Heart size normal.  Bone window
images demonstrate mild lower thoracic spondylosis and a left pars
defect at L5 without slip.
IMPRESSION: 1.  Interval slight increase in size of the diverticular abscess in
the central portion of the lower pelvis, adjacent to the proximal
sigmoid colon.  No free intraperitoneal air.  No new abscess.
2.  No new/acute or significant abnormalities otherwise.

## 2014-07-19 IMAGING — CR DG ABDOMEN 1V
1 series · 1 of 1 positions shown · non-contrast
Comparison: 08/26/2012

CLINICAL DATA: Abdominal pain

ABDOMEN - 1 VIEW

[t abdomen supine *]
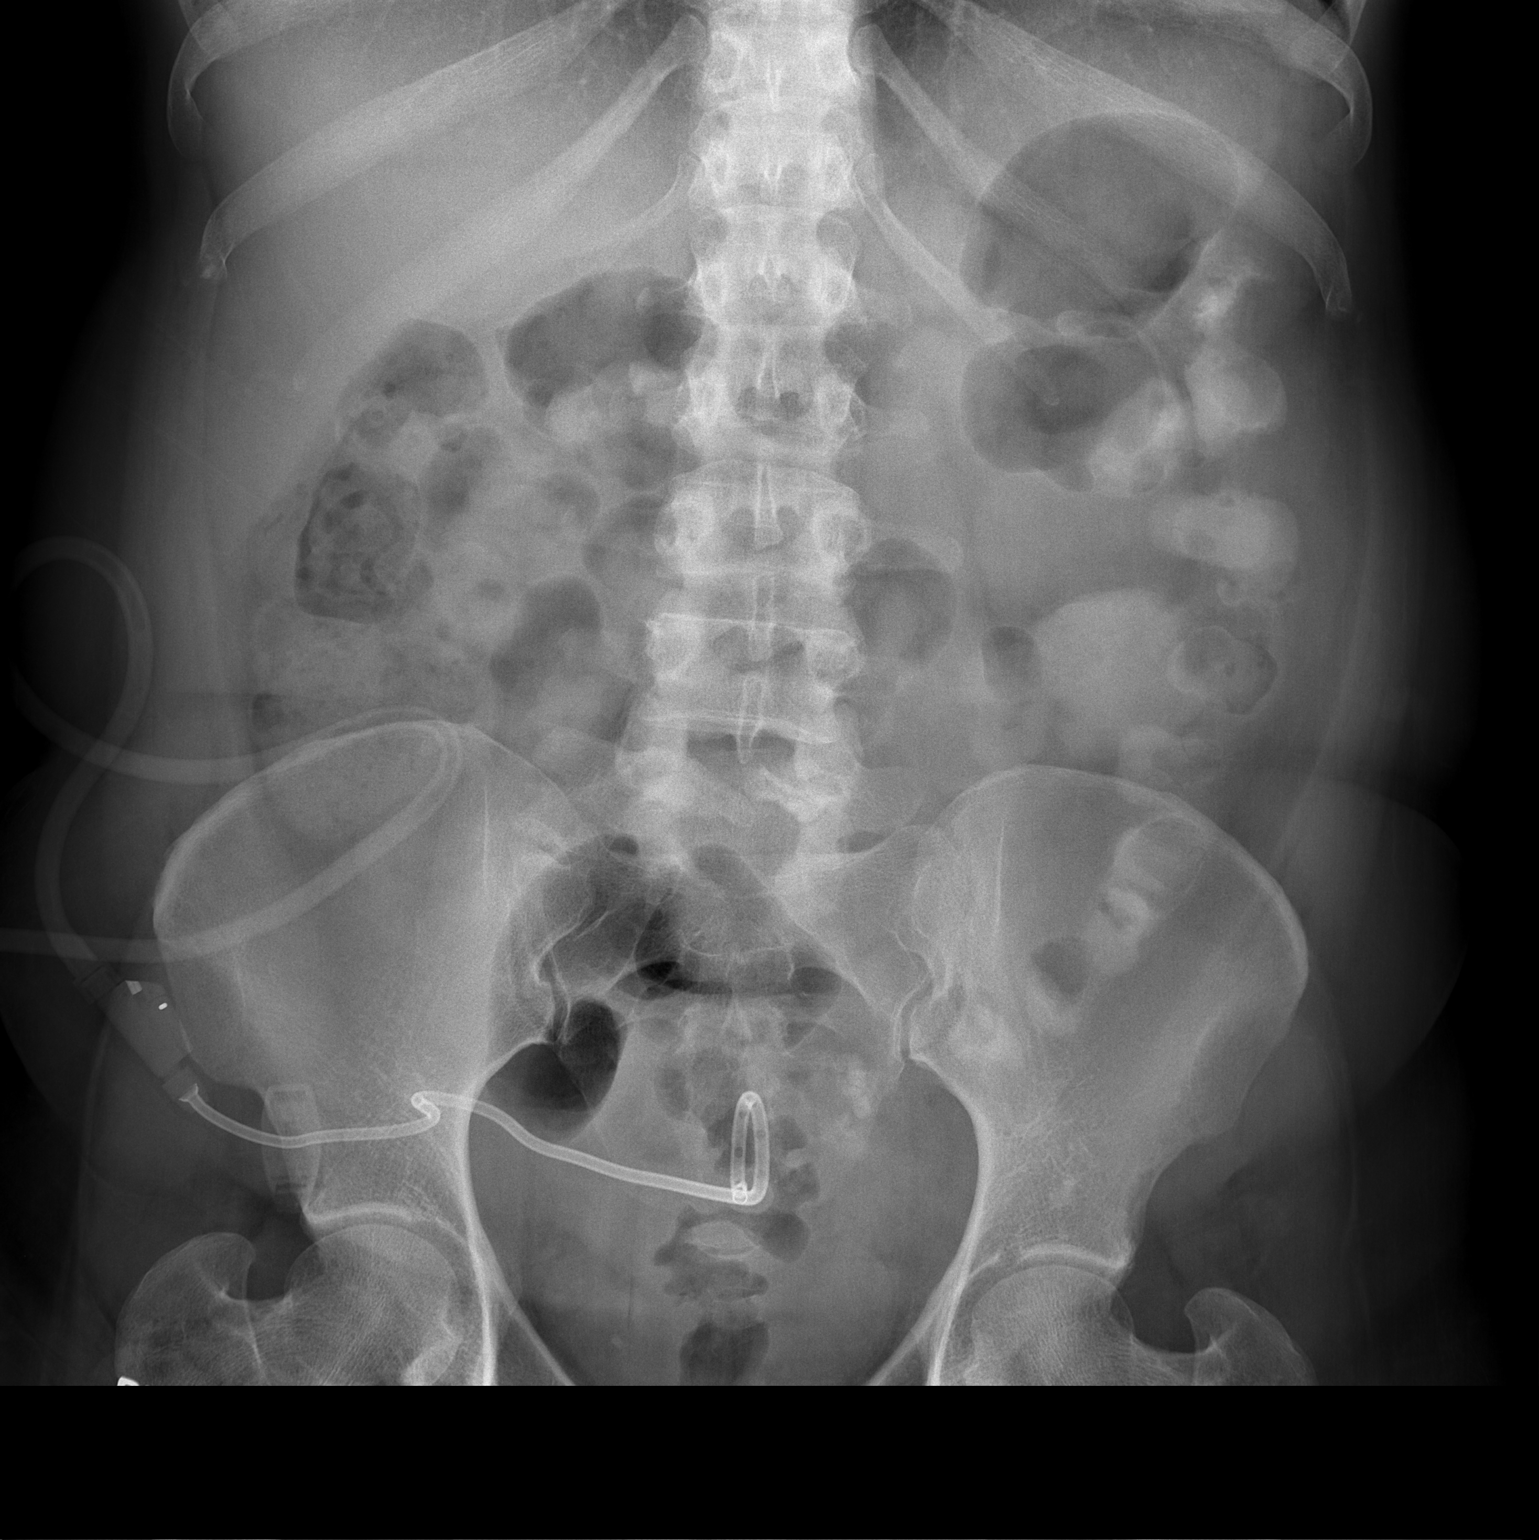

[1 of 1 positions shown; findings below may reference images not displayed]

FINDINGS: Pelvic presumed pigtail drainage catheter in place.  No
bowel distention. Presence or absence of air fluid levels or free
air cannot be assessed on this single supine view.  No acute
osseous finding. No abnormal calcific opacity.
IMPRESSION: Nonobstructive bowel gas pattern.
